# Patient Record
Sex: Male | Born: 1935 | Race: White | Hispanic: No | Marital: Married | Smoking: Never smoker
Health system: Southern US, Community
[De-identification: ages and names within clinical notes are randomized; demographics above are authoritative.]

## PROBLEM LIST (undated history)

## (undated) DIAGNOSIS — H409 Unspecified glaucoma: Secondary | ICD-10-CM

## (undated) DIAGNOSIS — B029 Zoster without complications: Secondary | ICD-10-CM

## (undated) DIAGNOSIS — Z789 Other specified health status: Secondary | ICD-10-CM

## (undated) DIAGNOSIS — N200 Calculus of kidney: Secondary | ICD-10-CM

## (undated) DIAGNOSIS — G459 Transient cerebral ischemic attack, unspecified: Secondary | ICD-10-CM

## (undated) DIAGNOSIS — I4891 Unspecified atrial fibrillation: Secondary | ICD-10-CM

## (undated) HISTORY — PX: NO PAST SURGERIES: SHX2092

## (undated) HISTORY — DX: Unspecified atrial fibrillation: I48.91

## (undated) HISTORY — DX: Unspecified glaucoma: H40.9

---

## 2000-08-18 ENCOUNTER — Encounter: Admission: RE | Admit: 2000-08-18 | Discharge: 2000-08-18 | Payer: Self-pay | Admitting: Urology

## 2000-08-18 ENCOUNTER — Encounter: Payer: Self-pay | Admitting: Urology

## 2000-08-20 ENCOUNTER — Encounter: Payer: Self-pay | Admitting: Urology

## 2000-08-21 ENCOUNTER — Encounter: Payer: Self-pay | Admitting: Urology

## 2000-08-21 ENCOUNTER — Ambulatory Visit (HOSPITAL_COMMUNITY): Admission: RE | Admit: 2000-08-21 | Discharge: 2000-08-21 | Payer: Self-pay | Admitting: Urology

## 2000-08-26 ENCOUNTER — Encounter: Payer: Self-pay | Admitting: Urology

## 2000-08-26 ENCOUNTER — Encounter: Admission: RE | Admit: 2000-08-26 | Discharge: 2000-08-26 | Payer: Self-pay | Admitting: Urology

## 2000-08-27 ENCOUNTER — Ambulatory Visit (HOSPITAL_COMMUNITY): Admission: RE | Admit: 2000-08-27 | Discharge: 2000-08-27 | Payer: Self-pay | Admitting: Urology

## 2001-10-21 ENCOUNTER — Encounter: Payer: Self-pay | Admitting: Urology

## 2001-10-21 ENCOUNTER — Encounter: Admission: RE | Admit: 2001-10-21 | Discharge: 2001-10-21 | Payer: Self-pay | Admitting: Urology

## 2008-10-11 ENCOUNTER — Encounter: Admission: RE | Admit: 2008-10-11 | Discharge: 2008-11-17 | Payer: Self-pay | Admitting: Family Medicine

## 2010-06-08 NOTE — Op Note (Signed)
[facility]  Patient:    Willie Cisneros, Willie Cisneros                     MRN: [id number] Proc. Date: 08/27/00 Adm. Date:  [id number] Attending:  Thermon Leyland CC:         Sigmund I. Patsi Sears, M.D.   Operative Report  PREOPERATIVE DIAGNOSIS:  Left mid ureteral calculus.  POSTOPERATIVE DIAGNOSIS:  Left mid ureteral calculus.  PROCEDURE:  Cystoscopy, ureteroscopy, holmium laser lithotripsy, double J stent placement.  SURGEON:  Barron Alvine, M.D.  ANESTHESIA:  General.  INDICATIONS FOR PROCEDURE:  [name] is a 77 year old male. He has generally been under the care of Dr. Patsi Sears and has had nephrolithiasis in the past. He recently had left flank pain and was diagnosed with a rather large 8 mm to 10 mm stone in the left mid ureter. Dr. Patsi Sears performed in situ lithotripsy. The patient had no passage of fragments. He did well clinically until approximately 48 hours ago when he began developing severe recurrent flank pain. Follow-up imaging showed no obvious change in the stone and it was in a similar location without any evidence of fragmentation. We therefore recommended consideration for alternative treatment options. We recommended holmium laser lithotripsy through a ureteroscope which he agreed to. Full informed consent was obtained.  TECHNIQUE AND FINDINGS:  The patient was brought to the operating room where he had successful induction of general anesthesia. He was placed in lithotomy position and prepped and draped in the usual manner. On fluoroscopy, one could see the obvious stone in the left mid ureter. Through the cystoscope, a Glidewire was passed to the left renal pelvis. A 6.5 French ureteroscope was then used. A large stone was encountered in the mid ureter. There was no evidence of any fragmentation from the lithotripsy. Utilizing the holmium laser fiber, we were to fracture the stone. It was a very hard stone and almost  certainly compromised of calcium oxalate monohydrate. With a laser, we were able to bust this into 20-30 pieces no larger than 1-2 mm. Over the guidewire, a 7 [place] 24 cm double J stent was placed with a dangle string which was attached to the patients penis. Lidocaine jelly was instilled per urethra and a B&O suppository was given. He was brought to the recovery room in stable condition. DD:  08/27/00 TD:  08/28/00 Job: [id number]

## 2012-11-18 ENCOUNTER — Encounter: Payer: Self-pay | Admitting: Podiatrist

## 2012-11-18 ENCOUNTER — Ambulatory Visit (INDEPENDENT_AMBULATORY_CARE_PROVIDER_SITE_OTHER): Payer: Medicare Other | Admitting: Podiatrist

## 2012-11-18 VITALS — BP 125/84 | HR 56 | Resp 17 | Ht 70.0 in | Wt 192.0 lb

## 2012-11-18 DIAGNOSIS — B351 Tinea unguium: Secondary | ICD-10-CM

## 2012-11-18 DIAGNOSIS — M79609 Pain in unspecified limb: Secondary | ICD-10-CM

## 2012-11-18 NOTE — Progress Notes (Signed)
HPI:  Patient presents today for follow up of foot and nail care. Denies any new complaints today.  Objective:  Patients chart is reviewed.  Neurovascular status unchanged.  Patients nails are thickened, discolored, distrophic, friable and brittle with yellow-brown discoloration. Patient subjectively relates they are painful with shoes and with ambulation.both feet  Assessment:  Symptomatic onychomycosis  Plan:  Discussed treatment options and alternatives.  The symptomatic toenails were debrided through manual an mechanical means without complication.  Return appointment recommended at routine intervals of 3 months   

## 2012-11-18 NOTE — Patient Instructions (Signed)
GENERAL FOOT HEALTH INFORMATION:  Moisturize your feet regularly with a cream based lotion such as Cetaphil (Cream) or Eucerin (Cream)- usually available in a tub or crock type of container.  Avoid applying the cream to the toe interspaces themselves to reduce the risk of a fungal infection between the toes.  After showering or bathing be sure to dry well between your toes.    Wear a good fitting shoe-  Running shoe brands I recommend are Brooks, New Balance and [employer]iics.  Always have a shoe fit specialist help you choose your shoes as there are many "varieties" of shoes and they can find you the best fit.  [employer]leet [employer]et sports/ [employer] ([place], [place], ), [employer], [employer] ([place]) have trained staff to help you in this process.  The Shoe Market in [place] has a great selection of euro-comfort casual shoes with good comfort and support as well.  Avoid prolonged use of thin ballet type flats, or flip flops.  Everyday use of these shoes can actually cause foot problems or injuries.  Watch your toenails for any signs of infection including drainage, pus redness or swelling along the sides of the toenails.  Soak in epsom salt water and use antibiotic ointment (OTC) if you notice this start to occur.  If the redness does not resolve within 2-3 days, call for an appointment to be seen.  

## 2013-01-27 ENCOUNTER — Ambulatory Visit (INDEPENDENT_AMBULATORY_CARE_PROVIDER_SITE_OTHER): Payer: Medicare Other | Admitting: Podiatrist

## 2013-01-27 ENCOUNTER — Encounter: Payer: Self-pay | Admitting: Podiatrist

## 2013-01-27 VITALS — BP 160/60 | HR 62 | Resp 12

## 2013-01-27 DIAGNOSIS — M79609 Pain in unspecified limb: Secondary | ICD-10-CM

## 2013-01-27 DIAGNOSIS — B351 Tinea unguium: Secondary | ICD-10-CM

## 2013-01-27 NOTE — Progress Notes (Signed)
HPI:  Patient presents today for follow up of foot and nail care. Denies any new complaints today.  Objective:  Patients chart is reviewed.  Neurovascular status unchanged.  Patients nails are thickened, discolored, distrophic, friable and brittle with yellow-brown discoloration. Patient subjectively relates they are painful with shoes and with ambulation.both feet  Assessment:  Symptomatic onychomycosis  Plan:  Discussed treatment options and alternatives.  The symptomatic toenails were debrided through manual an mechanical means without complication.  Return appointment recommended at routine intervals of 3 months   

## 2013-01-27 NOTE — Progress Notes (Deleted)
   Subjective:    Patient ID: Willie Cisneros, male    DOB: 1926/09/09, 80 y.o.   MRN: [id number]  HPI Comments: '' TOENAILS TRIM.''     Review of Systems     Objective:   Physical Exam        Assessment & Plan:

## 2013-04-01 ENCOUNTER — Ambulatory Visit (INDEPENDENT_AMBULATORY_CARE_PROVIDER_SITE_OTHER): Payer: Medicare Other | Admitting: Podiatrist

## 2013-04-01 ENCOUNTER — Encounter: Payer: Self-pay | Admitting: Podiatrist

## 2013-04-01 VITALS — BP 150/64 | HR 56 | Resp 12

## 2013-04-01 DIAGNOSIS — B351 Tinea unguium: Secondary | ICD-10-CM

## 2013-04-01 DIAGNOSIS — M79609 Pain in unspecified limb: Secondary | ICD-10-CM

## 2013-04-05 NOTE — Progress Notes (Signed)
HPI:  Patient presents today for follow up of foot and nail care. Denies any new complaints today.  Objective:  Patients chart is reviewed.  Vascular status reveals pedal pulses noted at 2 out of 4 dp and pt bilateral .  Neurological sensation is Normal to Semmes Weinstein monofilament bilateral.  Patients nails are thickened, discolored, distrophic, friable and brittle with yellow-brown discoloration. Patient subjectively relates they are painful with shoes and with ambulation of bilateral feet.  Assessment:  Symptomatic onychomycosis  Plan:  Discussed treatment options and alternatives.  The symptomatic toenails were debrided through manual an mechanical means without complication.  Return appointment recommended at routine intervals of 3 months    Destini Cambre, DPM   

## 2013-06-03 ENCOUNTER — Encounter: Payer: Self-pay | Admitting: Podiatrist

## 2013-06-03 ENCOUNTER — Ambulatory Visit (INDEPENDENT_AMBULATORY_CARE_PROVIDER_SITE_OTHER): Payer: Medicare Other | Admitting: Podiatrist

## 2013-06-03 VITALS — BP 154/72 | HR 59 | Resp 12

## 2013-06-03 DIAGNOSIS — B351 Tinea unguium: Secondary | ICD-10-CM

## 2013-06-03 DIAGNOSIS — M79609 Pain in unspecified limb: Secondary | ICD-10-CM

## 2013-06-04 NOTE — Progress Notes (Signed)
HPI:  Patient presents today for follow up of foot and nail care. Denies any new complaints today.  Objective:  Patients chart is reviewed.  Vascular status reveals pedal pulses noted at 2 out of 4 dp and pt bilateral .  Neurological sensation is Normal to Semmes Weinstein monofilament bilateral.  Patients nails are thickened, discolored, distrophic, friable and brittle with yellow-brown discoloration. Patient subjectively relates they are painful with shoes and with ambulation of bilateral feet.  Assessment:  Symptomatic onychomycosis  Plan:  Discussed treatment options and alternatives.  The symptomatic toenails were debrided through manual an mechanical means without complication.  Return appointment recommended at routine intervals of 3 months    Hilma Steinhilber, DPM   

## 2013-07-08 ENCOUNTER — Ambulatory Visit: Payer: Medicare Other | Admitting: Podiatrist

## 2013-08-12 ENCOUNTER — Ambulatory Visit (INDEPENDENT_AMBULATORY_CARE_PROVIDER_SITE_OTHER): Payer: Medicare Other | Admitting: Podiatrist

## 2013-08-12 VITALS — Resp 16

## 2013-08-12 DIAGNOSIS — B351 Tinea unguium: Secondary | ICD-10-CM

## 2013-08-12 DIAGNOSIS — M79609 Pain in unspecified limb: Secondary | ICD-10-CM

## 2013-08-12 DIAGNOSIS — M79673 Pain in unspecified foot: Secondary | ICD-10-CM

## 2013-08-12 NOTE — Progress Notes (Signed)
HPI:  Patient presents today for follow up of foot and nail care. Denies any new complaints today.  Objective:  Patients chart is reviewed.  Vascular status reveals pedal pulses noted at 2 out of 4 dp and pt bilateral .  Neurological sensation is Normal to Semmes Weinstein monofilament bilateral.  Patients nails are thickened, discolored, distrophic, friable and brittle with yellow-brown discoloration. Patient subjectively relates they are painful with shoes and with ambulation of bilateral feet.  Assessment:  Symptomatic onychomycosis  Plan:  Discussed treatment options and alternatives.  The symptomatic toenails were debrided through manual an mechanical means without complication.  Return appointment recommended at routine intervals of 3 months    Marceline Napierala, DPM   

## 2013-10-14 ENCOUNTER — Encounter: Payer: Self-pay | Admitting: Podiatrist

## 2013-10-14 ENCOUNTER — Ambulatory Visit (INDEPENDENT_AMBULATORY_CARE_PROVIDER_SITE_OTHER): Payer: Medicare Other | Admitting: Podiatrist

## 2013-10-14 DIAGNOSIS — M79676 Pain in unspecified toe(s): Principal | ICD-10-CM

## 2013-10-14 DIAGNOSIS — M79609 Pain in unspecified limb: Secondary | ICD-10-CM

## 2013-10-14 DIAGNOSIS — B351 Tinea unguium: Secondary | ICD-10-CM

## 2013-10-18 NOTE — Progress Notes (Signed)
HPI:  Patient presents today for follow up of foot and nail care. Denies any new complaints today.  Objective:  Patients chart is reviewed.  Vascular status reveals pedal pulses noted at 2 out of 4 dp and pt bilateral .  Neurological sensation is Normal to Semmes Weinstein monofilament bilateral.  Patients nails are thickened, discolored, distrophic, friable and brittle with yellow-brown discoloration. Patient subjectively relates they are painful with shoes and with ambulation of bilateral feet.  Assessment:  Symptomatic onychomycosis  Plan:  Discussed treatment options and alternatives.  The symptomatic toenails were debrided through manual an mechanical means without complication.  Return appointment recommended at routine intervals of 3 months    Kathryn Egerton, DPM   

## 2013-12-29 ENCOUNTER — Encounter: Payer: Self-pay | Admitting: Podiatrist

## 2013-12-29 ENCOUNTER — Ambulatory Visit (INDEPENDENT_AMBULATORY_CARE_PROVIDER_SITE_OTHER): Payer: Medicare Other | Admitting: Podiatrist

## 2013-12-29 DIAGNOSIS — M79676 Pain in unspecified toe(s): Secondary | ICD-10-CM

## 2013-12-29 DIAGNOSIS — B351 Tinea unguium: Secondary | ICD-10-CM

## 2014-01-10 NOTE — Progress Notes (Signed)
HPI:  Patient presents today for follow up of foot and nail care. Denies any new complaints today.  Objective:  Patients chart is reviewed.  Vascular status reveals pedal pulses noted at 2 out of 4 dp and pt bilateral .  Neurological sensation is Normal to Semmes Weinstein monofilament bilateral.  Patients nails are thickened, discolored, distrophic, friable and brittle with yellow-brown discoloration. Patient subjectively relates they are painful with shoes and with ambulation of bilateral feet.  Assessment:  Symptomatic onychomycosis  Plan:  Discussed treatment options and alternatives.  The symptomatic toenails were debrided through manual an mechanical means without complication.  Return appointment recommended at routine intervals of 3 months    Shaena Parkerson, DPM   

## 2014-03-03 ENCOUNTER — Encounter: Payer: Self-pay | Admitting: Podiatrist

## 2014-03-03 ENCOUNTER — Ambulatory Visit (INDEPENDENT_AMBULATORY_CARE_PROVIDER_SITE_OTHER): Payer: Medicare Other | Admitting: Podiatrist

## 2014-03-03 DIAGNOSIS — M79676 Pain in unspecified toe(s): Secondary | ICD-10-CM

## 2014-03-03 DIAGNOSIS — B351 Tinea unguium: Secondary | ICD-10-CM

## 2014-03-03 NOTE — Progress Notes (Signed)
HPI:  Patient presents today for follow up of foot and nail care. Denies any new complaints today.  Objective:  Patients chart is reviewed.  Vascular status reveals pedal pulses noted at 2 out of 4 dp and pt bilateral .  Neurological sensation is Normal to Semmes Weinstein monofilament bilateral.  Patients nails are thickened, discolored, distrophic, friable and brittle with yellow-brown discoloration. Patient subjectively relates they are painful with shoes and with ambulation of bilateral feet.  Assessment:  Symptomatic onychomycosis  Plan:  Discussed treatment options and alternatives.  The symptomatic toenails were debrided through manual an mechanical means without complication.  Return appointment recommended at routine intervals of 3 months    Kathryn Egerton, DPM   

## 2014-05-12 ENCOUNTER — Ambulatory Visit: Payer: Medicare Other | Admitting: Podiatrist

## 2014-05-18 ENCOUNTER — Ambulatory Visit (INDEPENDENT_AMBULATORY_CARE_PROVIDER_SITE_OTHER): Payer: Medicare Other | Admitting: Podiatry

## 2014-05-18 DIAGNOSIS — B351 Tinea unguium: Secondary | ICD-10-CM | POA: Diagnosis not present

## 2014-05-18 DIAGNOSIS — M79676 Pain in unspecified toe(s): Secondary | ICD-10-CM

## 2014-05-22 NOTE — Progress Notes (Signed)
Patient ID: Willie Cisneros, male   DOB: [birth year], 81 y.o.   MRN: [id number]  Subjective: 81 y.o.-year-old male returns the office today for painful, elongated, thickened toenails which he is unable to trim himself. Denies any redness or drainage around the nails. Denies any acute changes since last appointment and no new complaints today. Denies any systemic complaints such as fevers, chills, nausea, vomiting.   Objective: AAO 3, NAD DP/PT pulses palpable, CRT less than 3 seconds Protective sensation intact with Simms Weinstein monofilament, Achilles tendon reflex intact.  Nails hypertrophic, dystrophic, elongated, brittle, discolored 10. There is tenderness overlying these nails. There is no surrounding erythema or drainage along the nail sites. No open lesions or pre-ulcerative lesions are identified. No other areas of tenderness bilateral lower extremities. No overlying edema, erythema, increased warmth. No pain with calf compression, swelling, warmth, erythema.  Assessment: Patient presents with symptomatic onychomycosis  Plan: -Treatment options including alternatives, risks, complications were discussed -Nails sharply debrided 10 without complication/bleeding. -Discussed daily foot inspection. If there are any changes, to call the office immediately.  -Follow-up in 3 months or sooner if any problems are to arise. In the meantime, encouraged to call the office with any questions, concerns, changes symptoms.

## 2014-07-20 ENCOUNTER — Ambulatory Visit: Payer: Medicare Other | Admitting: Podiatry

## 2014-07-22 ENCOUNTER — Ambulatory Visit: Payer: Medicare Other

## 2014-08-04 ENCOUNTER — Ambulatory Visit: Payer: Medicare Other | Admitting: Podiatry

## 2019-04-03 ENCOUNTER — Inpatient Hospital Stay (HOSPITAL_COMMUNITY)
Admission: EM | Admit: 2019-04-03 | Discharge: 2019-04-09 | DRG: 175 | Disposition: A | Discharge status: Rehabilitation Facility | Payer: Medicare Other | Attending: Family Medicine | Admitting: Family Medicine

## 2019-04-03 ENCOUNTER — Emergency Department (HOSPITAL_COMMUNITY): Payer: Medicare Other

## 2019-04-03 ENCOUNTER — Inpatient Hospital Stay (HOSPITAL_COMMUNITY): Payer: Medicare Other

## 2019-04-03 ENCOUNTER — Other Ambulatory Visit: Payer: Self-pay

## 2019-04-03 ENCOUNTER — Encounter (HOSPITAL_COMMUNITY): Payer: Self-pay | Admitting: Pulmonary Disease

## 2019-04-03 DIAGNOSIS — S86891D Other injury of other muscle(s) and tendon(s) at lower leg level, right leg, subsequent encounter: Secondary | ICD-10-CM | POA: Diagnosis not present

## 2019-04-03 DIAGNOSIS — I82401 Acute embolism and thrombosis of unspecified deep veins of right lower extremity: Secondary | ICD-10-CM | POA: Diagnosis present

## 2019-04-03 DIAGNOSIS — W010XXD Fall on same level from slipping, tripping and stumbling without subsequent striking against object, subsequent encounter: Secondary | ICD-10-CM | POA: Diagnosis present

## 2019-04-03 DIAGNOSIS — E8809 Other disorders of plasma-protein metabolism, not elsewhere classified: Secondary | ICD-10-CM | POA: Diagnosis not present

## 2019-04-03 DIAGNOSIS — S83241D Other tear of medial meniscus, current injury, right knee, subsequent encounter: Secondary | ICD-10-CM | POA: Diagnosis not present

## 2019-04-03 DIAGNOSIS — H409 Unspecified glaucoma: Secondary | ICD-10-CM | POA: Diagnosis present

## 2019-04-03 DIAGNOSIS — I4819 Other persistent atrial fibrillation: Secondary | ICD-10-CM | POA: Diagnosis not present

## 2019-04-03 DIAGNOSIS — D62 Acute posthemorrhagic anemia: Secondary | ICD-10-CM | POA: Diagnosis present

## 2019-04-03 DIAGNOSIS — Z880 Allergy status to penicillin: Secondary | ICD-10-CM | POA: Diagnosis not present

## 2019-04-03 DIAGNOSIS — I4892 Unspecified atrial flutter: Secondary | ICD-10-CM | POA: Diagnosis present

## 2019-04-03 DIAGNOSIS — Y92008 Other place in unspecified non-institutional (private) residence as the place of occurrence of the external cause: Secondary | ICD-10-CM | POA: Diagnosis not present

## 2019-04-03 DIAGNOSIS — S80211A Abrasion, right knee, initial encounter: Secondary | ICD-10-CM | POA: Diagnosis present

## 2019-04-03 DIAGNOSIS — Z8673 Personal history of transient ischemic attack (TIA), and cerebral infarction without residual deficits: Secondary | ICD-10-CM | POA: Diagnosis not present

## 2019-04-03 DIAGNOSIS — W010XXA Fall on same level from slipping, tripping and stumbling without subsequent striking against object, initial encounter: Secondary | ICD-10-CM | POA: Diagnosis present

## 2019-04-03 DIAGNOSIS — Z85828 Personal history of other malignant neoplasm of skin: Secondary | ICD-10-CM

## 2019-04-03 DIAGNOSIS — Z87442 Personal history of urinary calculi: Secondary | ICD-10-CM | POA: Diagnosis not present

## 2019-04-03 DIAGNOSIS — E46 Unspecified protein-calorie malnutrition: Secondary | ICD-10-CM | POA: Diagnosis not present

## 2019-04-03 DIAGNOSIS — I2602 Saddle embolus of pulmonary artery with acute cor pulmonale: Secondary | ICD-10-CM

## 2019-04-03 DIAGNOSIS — S0240FA Zygomatic fracture, left side, initial encounter for closed fracture: Secondary | ICD-10-CM | POA: Diagnosis present

## 2019-04-03 DIAGNOSIS — R5381 Other malaise: Secondary | ICD-10-CM | POA: Diagnosis present

## 2019-04-03 DIAGNOSIS — S0240FD Zygomatic fracture, left side, subsequent encounter for fracture with routine healing: Secondary | ICD-10-CM | POA: Diagnosis not present

## 2019-04-03 DIAGNOSIS — Z881 Allergy status to other antibiotic agents status: Secondary | ICD-10-CM

## 2019-04-03 DIAGNOSIS — Z91041 Radiographic dye allergy status: Secondary | ICD-10-CM | POA: Diagnosis not present

## 2019-04-03 DIAGNOSIS — S83241A Other tear of medial meniscus, current injury, right knee, initial encounter: Secondary | ICD-10-CM | POA: Diagnosis present

## 2019-04-03 DIAGNOSIS — T1490XA Injury, unspecified, initial encounter: Secondary | ICD-10-CM | POA: Diagnosis not present

## 2019-04-03 DIAGNOSIS — Z79899 Other long term (current) drug therapy: Secondary | ICD-10-CM | POA: Diagnosis not present

## 2019-04-03 DIAGNOSIS — I429 Cardiomyopathy, unspecified: Secondary | ICD-10-CM | POA: Diagnosis present

## 2019-04-03 DIAGNOSIS — Y9301 Activity, walking, marching and hiking: Secondary | ICD-10-CM | POA: Diagnosis present

## 2019-04-03 DIAGNOSIS — I2699 Other pulmonary embolism without acute cor pulmonale: Secondary | ICD-10-CM | POA: Diagnosis present

## 2019-04-03 DIAGNOSIS — E440 Moderate protein-calorie malnutrition: Secondary | ICD-10-CM | POA: Diagnosis present

## 2019-04-03 DIAGNOSIS — I4891 Unspecified atrial fibrillation: Secondary | ICD-10-CM

## 2019-04-03 DIAGNOSIS — Z6824 Body mass index (BMI) 24.0-24.9, adult: Secondary | ICD-10-CM | POA: Diagnosis not present

## 2019-04-03 DIAGNOSIS — M25461 Effusion, right knee: Secondary | ICD-10-CM | POA: Diagnosis present

## 2019-04-03 DIAGNOSIS — I2609 Other pulmonary embolism with acute cor pulmonale: Principal | ICD-10-CM | POA: Diagnosis present

## 2019-04-03 DIAGNOSIS — S96912A Strain of unspecified muscle and tendon at ankle and foot level, left foot, initial encounter: Secondary | ICD-10-CM | POA: Diagnosis present

## 2019-04-03 DIAGNOSIS — Z885 Allergy status to narcotic agent status: Secondary | ICD-10-CM | POA: Diagnosis not present

## 2019-04-03 DIAGNOSIS — Z20822 Contact with and (suspected) exposure to covid-19: Secondary | ICD-10-CM | POA: Diagnosis present

## 2019-04-03 DIAGNOSIS — Z7901 Long term (current) use of anticoagulants: Secondary | ICD-10-CM

## 2019-04-03 DIAGNOSIS — S96912D Strain of unspecified muscle and tendon at ankle and foot level, left foot, subsequent encounter: Secondary | ICD-10-CM | POA: Diagnosis not present

## 2019-04-03 DIAGNOSIS — I248 Other forms of acute ischemic heart disease: Secondary | ICD-10-CM | POA: Diagnosis present

## 2019-04-03 DIAGNOSIS — K5900 Constipation, unspecified: Secondary | ICD-10-CM | POA: Diagnosis present

## 2019-04-03 DIAGNOSIS — G4700 Insomnia, unspecified: Secondary | ICD-10-CM | POA: Diagnosis present

## 2019-04-03 DIAGNOSIS — Z882 Allergy status to sulfonamides status: Secondary | ICD-10-CM | POA: Diagnosis not present

## 2019-04-03 HISTORY — DX: Calculus of kidney: N20.0

## 2019-04-03 HISTORY — DX: Transient cerebral ischemic attack, unspecified: G45.9

## 2019-04-03 HISTORY — DX: Other specified health status: Z78.9

## 2019-04-03 HISTORY — DX: Zoster without complications: B02.9

## 2019-04-03 LAB — BASIC METABOLIC PANEL
Anion gap: 10 (ref 5–15)
BUN: 17 mg/dL (ref 8–23)
CO2: 24 mmol/L (ref 22–32)
Calcium: 9 mg/dL (ref 8.9–10.3)
Chloride: 104 mmol/L (ref 98–111)
Creatinine, Ser: 1 mg/dL (ref 0.61–1.24)
GFR calc Af Amer: >60 mL/min (ref >60)
GFR calc non Af Amer: >60 mL/min (ref >60)
Glucose, Bld: 146 mg/dL — ABNORMAL HIGH (ref 70–99)
Potassium: 4.6 mmol/L (ref 3.5–5.1)
Sodium: 138 mmol/L (ref 135–145)

## 2019-04-03 LAB — ECHOCARDIOGRAM COMPLETE
Height: 69 in
Weight: 2800 oz

## 2019-04-03 LAB — CBC
HCT: 44 % (ref 39.0–52.0)
Hemoglobin: 14.3 g/dL (ref 13.0–17.0)
MCH: 31.2 pg (ref 26.0–34.0)
MCHC: 32.5 g/dL (ref 30.0–36.0)
MCV: 95.9 fL (ref 80.0–100.0)
Platelets: 127 10*3/uL — ABNORMAL LOW (ref 150–400)
RBC: 4.59 MIL/uL (ref 4.22–5.81)
RDW: 13.5 % (ref 11.5–15.5)
WBC: 12 10*3/uL — ABNORMAL HIGH (ref 4.0–10.5)
nRBC: 0 % (ref 0.0–0.2)

## 2019-04-03 LAB — TROPONIN I (HIGH SENSITIVITY)
Troponin I (High Sensitivity): 369 ng/L (ref <18)
Troponin I (High Sensitivity): 450 ng/L (ref <18)

## 2019-04-03 LAB — RESPIRATORY PANEL BY RT PCR (FLU A&B, COVID)
Influenza A by PCR: NEGATIVE
Influenza B by PCR: NEGATIVE
SARS Coronavirus 2 by RT PCR: NEGATIVE

## 2019-04-03 LAB — BRAIN NATRIURETIC PEPTIDE: B Natriuretic Peptide: 125.9 pg/mL — ABNORMAL HIGH (ref 0.0–100.0)

## 2019-04-03 LAB — MRSA PCR SCREENING: MRSA by PCR: NEGATIVE

## 2019-04-03 LAB — HEPARIN LEVEL (UNFRACTIONATED): Heparin Unfractionated: 0.2 IU/mL — ABNORMAL LOW (ref 0.30–0.70)

## 2019-04-03 MED ORDER — ORAL CARE MOUTH RINSE
15.0000 mL | Freq: Two times a day (BID) | OROMUCOSAL | Status: DC
  Administered 2019-04-03 – 2019-04-09 (×12): 15 mL via OROMUCOSAL

## 2019-04-03 MED ORDER — HEPARIN BOLUS VIA INFUSION
2500.0000 [IU] | Freq: Once | INTRAVENOUS | Status: AC
  Administered 2019-04-03: 2500 [IU] via INTRAVENOUS
  Filled 2019-04-03: qty 2500

## 2019-04-03 MED ORDER — IOHEXOL 350 MG/ML SOLN
100.0000 mL | Freq: Once | INTRAVENOUS | Status: AC | PRN
  Administered 2019-04-03: 100 mL via INTRAVENOUS

## 2019-04-03 MED ORDER — CHLORHEXIDINE GLUCONATE CLOTH 2 % EX PADS
6.0000 | MEDICATED_PAD | Freq: Every day | CUTANEOUS | Status: DC
  Administered 2019-04-03 – 2019-04-04 (×2): 6 via TOPICAL

## 2019-04-03 MED ORDER — SODIUM CHLORIDE (PF) 0.9 % IJ SOLN
INTRAMUSCULAR | Status: AC
  Filled 2019-04-03: qty 50

## 2019-04-03 MED ORDER — HEPARIN (PORCINE) 25000 UT/250ML-% IV SOLN
1400.0000 [IU]/h | INTRAVENOUS | Status: DC
  Administered 2019-04-03: 1200 [IU]/h via INTRAVENOUS
  Administered 2019-04-04: 1300 [IU]/h via INTRAVENOUS
  Administered 2019-04-05: 1400 [IU]/h via INTRAVENOUS
  Filled 2019-04-03 (×3): qty 250

## 2019-04-03 NOTE — ED Triage Notes (Signed)
Per patient, he is having sharp pains in left chest area. Patient says he can only take short breaths due to intense pain. Pain rated 9/10. Pain started 04/01/2019

## 2019-04-03 NOTE — H&P (Signed)
NAME:  Willie Cisneros, MRN:  [id number], DOB:  1926/12/08, LOS: 0 ADMISSION DATE:  04/03/2019, CONSULTATION DATE: 04/03/2019 REFERRING MD: Dr. Fredderick Phenix, CHIEF COMPLAINT: Pulmonary embolism  Brief History   Elderly gentleman who presented with shortness of breath and chest discomfort. He recently had sustained a left foot strain for which he had a boot, he tripped on the boot and fell on March 7.  He did hit his head right side of his face, stated his head bounced off the concrete about twice.  There was no loss of consciousness. Shortness of breath with minimal exertion, left-sided chest pains He has had some pain in his right knee as well following the fall Unable to take deep breaths because of pain Past Medical History  Only significant for glaucoma  Significant Hospital Events   CT scan of the chest  Consults:  pccm  Procedures:  None  Significant Diagnostic Tests:  CT chest The examination is positive for pulmonary embolus with findings worrisome for right heart strain. Peripheral wedge-shaped opacity in left lower lobe is worrisome for a pulmonary infarct.  CT head IMPRESSION: 1. No evidence of acute intracranial abnormality. No evidence of calvarial fracture. 2. Mild chronic small vessel ischemic changes in the cerebral white matter. 3. Nondisplaced left zygomatic arch fracture appears well corticated and chronic, with no overlying soft tissue swelling. 4. No cervical spine fracture or facet subluxation. 5. 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 6. Mild multilevel cervical spondylolisthesis is probably degenerative.  Micro Data:  None  Antimicrobials:  None  Interim history/subjective:  Elderly gentleman, feels more comfortable since has been in the hospital Still has some chest discomfort, breathing feels a little bit better  Objective   Blood pressure (!) 149/94, pulse (!) 129, temperature 98.1 F (36.7 C),  temperature source Oral, resp. rate (!) 31, height 5\' 9"  (1.753 m), weight 79.4 kg, SpO2 100 %.       No intake or output data in the 24 hours ending 04/03/19 1408 Filed Weights   04/03/19 0837  Weight: 79.4 kg    Examination: General: Elderly gentleman, ecchymosis, periorbital edema on the right HENT: Moist oral mucosa Lungs: Decreased air movement secondary to poor effort, no added sounds Cardiovascular: S1-S2 appreciated Abdomen: Bowel sounds appreciated Extremities: No clubbing, no edema, has a boot on the left foot Neuro: Alert and oriented x3 GU:   Resolved Hospital Problem list     Assessment & Plan:   Pulmonary embolism -Presence of right heart strain -Not hypotensive -Tachycardic, tachypneic -Elevated troponin -Not requiring oxygen -Anticoagulation with heparin  Pulmonary infarct -Related to his pulmonary embolism -He declines to have any pain medications at present -I did reassure him that we can put him on pain medications as needed  Recent facial trauma on March 7 with periorbital ecchymosis, periorbital edema His recent facial trauma will make him a poor candidate for thrombolytics, this combined with hemodynamic stability Continue anticoagulation We will monitor him closely for any risk of decompensation Trend his troponins   Stat echocardiogram ordered   Best practice:  Diet: Regular diet Pain/Anxiety/Delirium protocol (if indicated): None VAP protocol (if indicated): Not indicated DVT prophylaxis: On anticoagulation GI prophylaxis: Pepcid Glucose control: Monitor Mobility: Bedrest Code Status: Full code Family Communication: Called son Billie, went to voicemail Disposition: ICU  Labs   CBC: Recent Labs  Lab 04/03/19 0917  WBC 12.0*  HGB 14.3  HCT 44.0  MCV 95.9  PLT 127*    Basic  Metabolic Panel: Recent Labs  Lab 04/03/19 0917  NA 138  K 4.6  CL 104  CO2 24  GLUCOSE 146*  BUN 17  CREATININE 1.00  CALCIUM 9.0    GFR: Estimated Creatinine Clearance: 49.1 mL/min (by C-G formula based on SCr of 1 mg/dL). Recent Labs  Lab 04/03/19 0917  WBC 12.0*    Liver Function Tests: No results for input(s): AST, ALT, ALKPHOS, BILITOT, PROT, ALBUMIN in the last 168 hours. No results for input(s): LIPASE, AMYLASE in the last 168 hours. No results for input(s): AMMONIA in the last 168 hours.  ABG No results found for: PHART, PCO2ART, PO2ART, HCO3, TCO2, ACIDBASEDEF, O2SAT   Coagulation Profile: No results for input(s): INR, PROTIME in the last 168 hours.  Cardiac Enzymes: No results for input(s): CKTOTAL, CKMB, CKMBINDEX, TROPONINI in the last 168 hours.  HbA1C: No results found for: HGBA1C  CBG: No results for input(s): GLUCAP in the last 168 hours.  Review of Systems:   Chest pain discomfort  Past Medical History  He,  has a past medical history of Glaucoma.   Surgical History      Social History   reports that he has never smoked. He does not have any smokeless tobacco history on file.   Family History   His family history is not on file.   Allergies Allergies  Allergen Reactions  . Penicillins Anaphylaxis    Did it involve swelling of the face/tongue/throat, SOB, or low BP? Yes Did it involve sudden or severe rash/hives, skin peeling, or any reaction on the inside of your mouth or nose? Yes Did you need to seek medical attention at a hospital or doctor's office? Yes When did it last happen?Over 10 years ago If all above answers are "NO", may proceed with cephalosporin use.  . Sulfa Antibiotics Anaphylaxis and Rash  . Clindamycin Nausea Only  . Contrast Media  [Iodinated Diagnostic Agents] Nausea Only  . Econazole Nitrate Itching and Other (See Comments)    Burning  . Erythromycin Nausea Only  . Oxycodone Nausea And Vomiting    Sherrilyn Rist, MD [place], PCCM Cell: [phone]

## 2019-04-03 NOTE — ED Notes (Signed)
Date and time results received: 04/03/19 11:04 AM  (use smartphrase ".now" to insert current time)  Test: troponin Critical Value: 369  Name of Provider Notified: [facility]  Orders Received? Or Actions Taken?:

## 2019-04-03 NOTE — Progress Notes (Signed)
Echocardiogram 2D Echocardiogram has been performed.  Willie Cisneros 04/03/2019, 2:58 PM   Notified Dr. Jacques Navy of stat at 2:55

## 2019-04-03 NOTE — ED Notes (Signed)
Dr. Fredderick Phenix aware patients second troponin is 450.

## 2019-04-03 NOTE — Progress Notes (Signed)
ANTICOAGULATION CONSULT NOTE - Initial Consult  Pharmacy Consult for IV heparin  Indication: pulmonary embolus  Allergies  Allergen Reactions  . Penicillins Anaphylaxis    Did it involve swelling of the face/tongue/throat, SOB, or low BP? Yes Did it involve sudden or severe rash/hives, skin peeling, or any reaction on the inside of your mouth or nose? Yes Did you need to seek medical attention at a hospital or doctor's office? Yes When did it last happen?Over 10 years ago If all above answers are "NO", may proceed with cephalosporin use.  . Sulfa Antibiotics Anaphylaxis and Rash  . Clindamycin Nausea Only  . Contrast Media  [Iodinated Diagnostic Agents] Nausea Only  . Econazole Nitrate Itching and Other (See Comments)    Burning  . Erythromycin Nausea Only  . Oxycodone Nausea And Vomiting    Patient Measurements: Height: 5\' 9"  (175.3 cm) Weight: 175 lb (79.4 kg) IBW/kg (Calculated) : 70.7 Heparin Dosing Weight: 79.4 kg  Vital Signs: Temp: 98.1 F (36.7 C) (03/13 1106) Temp Source: Oral (03/13 1106) BP: 149/94 (03/13 1200) Pulse Rate: 129 (03/13 1208)  Labs: Recent Labs    04/03/19 0917 04/03/19 1034  HGB 14.3  --   HCT 44.0  --   PLT 127*  --   CREATININE 1.00  --   TROPONINIHS 369* 450*    Estimated Creatinine Clearance: 49.1 mL/min (by C-G formula based on SCr of 1 mg/dL).   Medical History: Past Medical History:  Diagnosis Date  . Glaucoma     Medications:  Scheduled:  . sodium chloride (PF)       Infusions:    Assessment: 86 yo male presented to ED with sharp pains in left chest area found to have PE worrisome for heart strain per CT exam. To start IV heparin per Rx. Baseline labs drawn.   Goal of Therapy:  Heparin level 0.3-0.7 units/ml Monitor platelets by anticoagulation protocol: Yes   Plan:   IV heparin 2500 unit bolus then  IV heparin infusion rate of 1200 units/hr  Check heparin level 8 hours after start of IV  heparin  Daily CBC   95 04/03/2019,1:14 PM

## 2019-04-03 NOTE — ED Provider Notes (Signed)
[facility]-EMERGENCY DEPT Provider Note   CSN: [id number] Arrival date & time: 04/03/19  0272     History Chief Complaint  Patient presents with  . Chest Pain  . Shortness of Breath    Willie Cisneros is a 86 y.o. male.  Patient is a 86 year old male who presents with shortness of breath.  He states that he had a fall on March 7.  He has been wearing a postop shoe for stress fracture of his toe.  He said his right foot stepped on the postop shoe that was on the left foot and he tripped, falling over onto his right side.  He hit his right side of his head.  There is no loss of consciousness.  He has had some swelling and pain to his right temporal area.  He said he saw his ophthalmologist who said his eye looked okay but he should get further imaging of the bony structures around his eye.  He never went for evaluation of that.  He said that over the last few days he has had some worsening shortness of breath.  He reports shortness of breath with minimal exertion.  He has a sharp pain in his left chest that is worse with breathing.  He is only able to take short rapid breaths because of the pain.  He denies any known injury to that chest wall on falling.  He says that he was starting to have some shortness of breath a week or so before the fall but it seems to have gotten worse since the fall.  He denies any change in his leg swelling.  No fevers.  No cough or cold symptoms.  He does say that his knee is also been hurting since the fall.  It was not hurting initially but over the last couple days is gotten worse.  He feels like he is having some cramping in his upper thigh that makes his leg feel weak.  He denies any weakness in his upper extremities.  No other injuries noted from the fall.        Past Medical History:  Diagnosis Date  . Glaucoma     There are no problems to display for this patient.    The histories are not reviewed yet. Please review them in the  "History" navigator section and refresh this SmartLink.     No family history on file.  Social History   Tobacco Use  . Smoking status: Never Smoker  Substance Use Topics  . Alcohol use: Not on file  . Drug use: Not on file    Home Medications Prior to Admission medications   Medication Sig Start Date End Date Taking? Authorizing Provider  latanoprost (XALATAN) 0.005 % ophthalmic solution Place 1 drop into both eyes at bedtime.    Yes [provider]  Multiple Vitamins-Minerals (CENTRUM SILVER 50+MEN) TABS Take 1 tablet by mouth daily.   Yes [provider]    Allergies    Penicillins, Sulfa antibiotics, Clindamycin, Contrast media  [iodinated diagnostic agents], Econazole nitrate, Erythromycin, and Oxycodone  Review of Systems   Review of Systems  Constitutional: Positive for fatigue. Negative for chills, diaphoresis and fever.  HENT: Negative for congestion, rhinorrhea and sneezing.   Eyes: Negative.   Respiratory: Positive for shortness of breath. Negative for cough and chest tightness.   Cardiovascular: Positive for chest pain. Negative for leg swelling.  Gastrointestinal: Negative for abdominal pain, blood in stool, diarrhea, nausea and vomiting.  Genitourinary: Negative for difficulty urinating, flank pain, frequency and hematuria.  Musculoskeletal: Positive for arthralgias. Negative for back pain.  Skin: Negative for rash.  Neurological: Negative for dizziness, speech difficulty, weakness, numbness and headaches.    Physical Exam Updated Vital Signs BP (!) 149/94   Pulse (!) 129   Temp 98.1 F (36.7 C) (Oral)   Resp (!) 31   Ht 5\' 9"  (1.753 m)   Wt 79.4 kg   SpO2 100%   BMI 25.84 kg/m   Physical Exam Constitutional:      Appearance: He is well-developed.  HENT:     Head: Normocephalic.     Comments: Patient has some old appearing ecchymosis to the right forehead and right temporal area.  There is some right periorbital  ecchymosis. Eyes:     Extraocular Movements: Extraocular movements intact.     Conjunctiva/sclera: Conjunctivae normal.     Pupils: Pupils are equal, round, and reactive to light.  Cardiovascular:     Rate and Rhythm: Regular rhythm. Tachycardia present.     Heart sounds: Normal heart sounds.     Comments: No tenderness to the rib cage, no visible external trauma noted to the chest or abdomen Pulmonary:     Effort: Pulmonary effort is normal. Tachypnea present. No respiratory distress.     Breath sounds: Normal breath sounds. No wheezing or rales.  Chest:     Chest wall: No tenderness.  Abdominal:     General: Bowel sounds are normal.     Palpations: Abdomen is soft.     Tenderness: There is no abdominal tenderness. There is no guarding or rebound.  Musculoskeletal:        General: Normal range of motion.     Cervical back: Normal range of motion and neck supple.     Comments: 1+ edema to the lower extremities bilaterally, slightly worse on the left, there is an abrasion over the right knee.  There are some mild tenderness on palpation of the knee.  No significant swelling.  He is able do a straight leg raise.  Pedal pulses are intact bilaterally.  There is no other pain on palpation or range of motion the extremities  Lymphadenopathy:     Cervical: No cervical adenopathy.  Skin:    General: Skin is warm and dry.     Findings: No rash.  Neurological:     Mental Status: He is alert and oriented to person, place, and time.     Comments: 5 out of 5 strength in all extremities, sensation grossly intact to light touch in all extremities, cranial nerves II through XII grossly intact     ED Results / Procedures / Treatments   Labs (all labs ordered are listed, but only abnormal results are displayed) Labs Reviewed  BASIC METABOLIC PANEL - Abnormal; Notable for the following components:      Result Value   Glucose, Bld 146 (*)    All other components within normal limits  CBC -  Abnormal; Notable for the following components:   WBC 12.0 (*)    Platelets 127 (*)    All other components within normal limits  BRAIN NATRIURETIC PEPTIDE - Abnormal; Notable for the following components:   B Natriuretic Peptide 125.9 (*)    All other components within normal limits  TROPONIN I (HIGH SENSITIVITY) - Abnormal; Notable for the following components:   Troponin I (High Sensitivity) 369 (*)    All other components within normal limits  TROPONIN I (HIGH  SENSITIVITY) - Abnormal; Notable for the following components:   Troponin I (High Sensitivity) 450 (*)    All other components within normal limits  RESPIRATORY PANEL BY RT PCR (FLU A&B, COVID)  HEPARIN LEVEL (UNFRACTIONATED)    EKG EKG Interpretation  Date/Time:  Saturday April 03 2019 09:09:13 EST Ventricular Rate:  132 PR Interval:    QRS Duration: 95 QT Interval:  317 QTC Calculation: 470 R Axis:   -62 Text Interpretation: Sinus tachycardia LAD, consider left anterior fascicular block Confirmed by Rolan BuccoBelfi, Gianmarco Roye [phone]([id number]) on 04/03/2019 9:24:59 AM   Radiology DG Chest 2 View  Result Date: 04/03/2019 CLINICAL DATA:  Left chest pain EXAM: CHEST - 2 VIEW COMPARISON:  None. FINDINGS: Mild blunting left costophrenic angle, favored to reflect epicardial fat, clear on the lateral view. Right lung is clear. No pleural effusion or pneumothorax. The heart is top-normal in size. Visualized osseous structures are within normal limits. IMPRESSION: Normal chest radiographs. Electronically Signed   By: Charline BillsSriyesh  Krishnan M.D.   On: 04/03/2019 09:22   CT Head Wo Contrast  Result Date: 04/03/2019 CLINICAL DATA:  Fall a few days prior. Right orbital bruise. Posterior neck pain. EXAM: CT HEAD WITHOUT CONTRAST CT CERVICAL SPINE WITHOUT CONTRAST TECHNIQUE: Multidetector CT imaging of the head and cervical spine was performed following the standard protocol without intravenous contrast. Multiplanar CT image reconstructions of the cervical  spine were also generated. COMPARISON:  None. FINDINGS: CT HEAD FINDINGS Brain: No evidence of parenchymal hemorrhage or extra-axial fluid collection. No mass lesion, mass effect, or midline shift. No CT evidence of acute infarction. Nonspecific mild subcortical and periventricular white matter hypodensity, most in keeping with chronic small vessel ischemic change. Cerebral volume is age appropriate. No ventriculomegaly. Vascular: No acute abnormality. Skull: No evidence of calvarial fracture. Nondisplaced left zygomatic arch fracture appears well corticated and chronic, with no overlying soft tissue swelling. Sinuses/Orbits: The visualized paranasal sinuses are essentially clear. Other:  The mastoid air cells are unopacified. CT CERVICAL SPINE FINDINGS Alignment: Straightening of the cervical spine. No facet subluxation. Dens is well positioned between the lateral masses of C1. Mild 3 mm retrolisthesis at C5-6 and C6-7 and 3 mm anterolisthesis at C7-T1. Skull base and vertebrae: No acute fracture. No primary bone lesion or focal pathologic process. Soft tissues and spinal canal: No prevertebral edema. No visible canal hematoma. Disc levels: Severe multilevel degenerative disc disease throughout the cervical spine, most prominent at C3-4, C5-6 and C6-7. Moderate to severe bilateral cervical facet arthropathy. Moderate degenerative foraminal stenosis bilaterally at C5-6. Upper chest: No acute abnormality. Other: Visualized mastoid air cells appear clear. No discrete thyroid nodules. No pathologically enlarged cervical nodes. IMPRESSION: 1. No evidence of acute intracranial abnormality. No evidence of calvarial fracture. 2. Mild chronic small vessel ischemic changes in the cerebral white matter. 3. Nondisplaced left zygomatic arch fracture appears well corticated and chronic, with no overlying soft tissue swelling. 4. No cervical spine fracture or facet subluxation. 5. Advanced multilevel degenerative changes in the  cervical spine as detailed. 6. Mild multilevel cervical spondylolisthesis is probably degenerative. Electronically Signed   By: Delbert PhenixJason A Poff M.D.   On: 04/03/2019 11:56   CT Angio Chest PE W and/or Wo Contrast  Result Date: 04/03/2019 CLINICAL DATA:  Severe left chest pain today. EXAM: CT ANGIOGRAPHY CHEST WITH CONTRAST TECHNIQUE: Multidetector CT imaging of the chest was performed using the standard protocol during bolus administration of intravenous contrast. Multiplanar CT image reconstructions and MIPs were obtained to evaluate the vascular anatomy.  CONTRAST:  100 mL OMNIPAQUE IOHEXOL 350 MG/ML SOLN COMPARISON:  PA and lateral chest today. FINDINGS: Cardiovascular: The examination is positive for pulmonary embolus. Clot is best seen in the left main pulmonary artery and its branches to both the upper and lower lobes. The RV to LV ratio is 1.22 worrisome for right heart strain. Cardiomegaly and calcific aortic and coronary atherosclerosis are noted. No pericardial effusion. Thoracic aorta is normal in caliber. Mediastinum/Nodes: No enlarged mediastinal, hilar, or axillary lymph nodes. Thyroid gland, trachea, and esophagus demonstrate no significant findings. Lungs/Pleura: Very small left pleural effusion is seen. Wedge-shaped peripheral opacity in the left lower lobe is worrisome for a pulmonary infarct. Dependent atelectasis is noted. Upper Abdomen: Single, small nonobstructing stones are seen in each kidney Musculoskeletal: No fracture or worrisome lesion. Multilevel spondylosis and mild scoliosis noted. Review of the MIP images confirms the above findings. IMPRESSION: The examination is positive for pulmonary embolus with findings worrisome for right heart strain. Peripheral wedge-shaped opacity in left lower lobe is worrisome for a pulmonary infarct. Cardiomegaly. Critical Value/emergent results were called by telephone at the time of interpretation on 04/03/2019 at 11:52 am to provider Amairani Shuey , who  verbally acknowledged these results. Aortic Atherosclerosis (ICD10-I70.0). Calcific coronary artery disease also noted. Electronically Signed   By: Drusilla Kanner M.D.   On: 04/03/2019 11:55   CT Cervical Spine Wo Contrast  Result Date: 04/03/2019 CLINICAL DATA:  Fall a few days prior. Right orbital bruise. Posterior neck pain. EXAM: CT HEAD WITHOUT CONTRAST CT CERVICAL SPINE WITHOUT CONTRAST TECHNIQUE: Multidetector CT imaging of the head and cervical spine was performed following the standard protocol without intravenous contrast. Multiplanar CT image reconstructions of the cervical spine were also generated. COMPARISON:  None. FINDINGS: CT HEAD FINDINGS Brain: No evidence of parenchymal hemorrhage or extra-axial fluid collection. No mass lesion, mass effect, or midline shift. No CT evidence of acute infarction. Nonspecific mild subcortical and periventricular white matter hypodensity, most in keeping with chronic small vessel ischemic change. Cerebral volume is age appropriate. No ventriculomegaly. Vascular: No acute abnormality. Skull: No evidence of calvarial fracture. Nondisplaced left zygomatic arch fracture appears well corticated and chronic, with no overlying soft tissue swelling. Sinuses/Orbits: The visualized paranasal sinuses are essentially clear. Other:  The mastoid air cells are unopacified. CT CERVICAL SPINE FINDINGS Alignment: Straightening of the cervical spine. No facet subluxation. Dens is well positioned between the lateral masses of C1. Mild 3 mm retrolisthesis at C5-6 and C6-7 and 3 mm anterolisthesis at C7-T1. Skull base and vertebrae: No acute fracture. No primary bone lesion or focal pathologic process. Soft tissues and spinal canal: No prevertebral edema. No visible canal hematoma. Disc levels: Severe multilevel degenerative disc disease throughout the cervical spine, most prominent at C3-4, C5-6 and C6-7. Moderate to severe bilateral cervical facet arthropathy. Moderate  degenerative foraminal stenosis bilaterally at C5-6. Upper chest: No acute abnormality. Other: Visualized mastoid air cells appear clear. No discrete thyroid nodules. No pathologically enlarged cervical nodes. IMPRESSION: 1. No evidence of acute intracranial abnormality. No evidence of calvarial fracture. 2. Mild chronic small vessel ischemic changes in the cerebral white matter. 3. Nondisplaced left zygomatic arch fracture appears well corticated and chronic, with no overlying soft tissue swelling. 4. No cervical spine fracture or facet subluxation. 5. 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 6. Mild multilevel cervical spondylolisthesis is probably degenerative. Electronically Signed   By: Delbert Phenix M.D.   On: 04/03/2019 11:56   DG Knee Complete 4  Views Right  Result Date: 04/03/2019 CLINICAL DATA:  Acute RIGHT knee pain following fall several days ago. Initial encounter. EXAM: RIGHT KNEE - COMPLETE 4+ VIEW COMPARISON:  None. FINDINGS: No acute fracture, subluxation or dislocation identified. Minimal tricompartmental joint space narrowing/osteophytosis noted. There is no evidence of joint effusion. No focal bony lesions are present. Heavy vascular calcifications are identified. IMPRESSION: 1. No evidence of acute abnormality. 2. Minimal tricompartmental degenerative changes. Electronically Signed   By: Margarette Canada M.D.   On: 04/03/2019 09:55    Procedures Procedures (including critical care time)  Medications Ordered in ED Medications  sodium chloride (PF) 0.9 % injection (has no administration in time range)  heparin bolus via infusion 2,500 Units (2,500 Units Intravenous Bolus from Bag 04/03/19 1339)    Followed by  heparin ADULT infusion 100 units/mL (25000 units/262mL sodium chloride 0.45%) (1,200 Units/hr Intravenous New Bag/Given 04/03/19 1340)  iohexol (OMNIPAQUE) 350 MG/ML injection 100 mL (100 mLs Intravenous Contrast Given 04/03/19 1128)    ED Course   I have reviewed the triage vital signs and the nursing notes.  Pertinent labs & imaging results that were available during my care of the patient were reviewed by me and considered in my medical decision making (see chart for details).    MDM Rules/Calculators/A&P                      Patient is a 86 year old male who presents with right-sided chest pain and shortness of breath.  He did have a recent fall but he did not have a report of injury to that side.  Given his fall, I did do a CT scan of his head and cervical spine which showed no acute abnormalities.  He had an x-ray of his right knee which shows no acute abnormalities.  He reported some weakness in the right leg.  He seems to have good strength on my exam and it seems that the weakness is related to his pain and muscle cramping in that leg.  He does not have any other symptoms concerning for stroke.  He is very tachypneic and tachycardic.  CT scan of his chest reveals large burden of left-sided PE.  There is evidence of right heart strain and pulmonary infarct as well.  He was started on heparin.  I placed him on oxygen for comfort due to his tachypnea although he is not hypoxic.  His blood pressures been stable.  I spoke with the pulmonary critical care team who is evaluated the patient and will admit the patient to the ICU for further treatment.  He was not deemed a candidate for TPA given his recent head trauma.  CRITICAL CARE Performed by: Malvin Johns Total critical care time: 60 minutes Critical care time was exclusive of separately billable procedures and treating other patients. Critical care was necessary to treat or prevent imminent or life-threatening deterioration. Critical care was time spent personally by me on the following activities: development of treatment plan with patient and/or surrogate as well as nursing, discussions with consultants, evaluation of patient's response to treatment, examination of patient, obtaining  history from patient or surrogate, ordering and performing treatments and interventions, ordering and review of laboratory studies, ordering and review of radiographic studies, pulse oximetry and re-evaluation of patient's condition.  Final Clinical Impression(s) / ED Diagnoses Final diagnoses:  Acute pulmonary embolism with acute cor pulmonale, unspecified pulmonary embolism type ([place])    Rx / DC Orders ED Discharge Orders  None       Rolan Bucco, MD 04/03/19 1402

## 2019-04-03 NOTE — ED Notes (Signed)
ED Provider at bedside. 

## 2019-04-04 ENCOUNTER — Other Ambulatory Visit: Payer: Self-pay

## 2019-04-04 LAB — CBC
HCT: 40.8 % (ref 39.0–52.0)
Hemoglobin: 12.8 g/dL — ABNORMAL LOW (ref 13.0–17.0)
MCH: 30.3 pg (ref 26.0–34.0)
MCHC: 31.4 g/dL (ref 30.0–36.0)
MCV: 96.7 fL (ref 80.0–100.0)
Platelets: 141 10*3/uL — ABNORMAL LOW (ref 150–400)
RBC: 4.22 MIL/uL (ref 4.22–5.81)
RDW: 13.6 % (ref 11.5–15.5)
WBC: 10 10*3/uL (ref 4.0–10.5)
nRBC: 0 % (ref 0.0–0.2)

## 2019-04-04 LAB — BASIC METABOLIC PANEL
Anion gap: 11 (ref 5–15)
BUN: 18 mg/dL (ref 8–23)
CO2: 25 mmol/L (ref 22–32)
Calcium: 8.6 mg/dL — ABNORMAL LOW (ref 8.9–10.3)
Chloride: 102 mmol/L (ref 98–111)
Creatinine, Ser: 0.96 mg/dL (ref 0.61–1.24)
GFR calc Af Amer: >60 mL/min (ref >60)
GFR calc non Af Amer: >60 mL/min (ref >60)
Glucose, Bld: 131 mg/dL — ABNORMAL HIGH (ref 70–99)
Potassium: 4.1 mmol/L (ref 3.5–5.1)
Sodium: 138 mmol/L (ref 135–145)

## 2019-04-04 LAB — HEPARIN LEVEL (UNFRACTIONATED)
Heparin Unfractionated: 0.35 IU/mL (ref 0.30–0.70)
Heparin Unfractionated: 0.31 IU/mL (ref 0.30–0.70)

## 2019-04-04 MED ORDER — LATANOPROST 0.005 % OP SOLN
1.0000 [drp] | Freq: Every day | OPHTHALMIC | Status: DC
  Administered 2019-04-04 – 2019-04-08 (×5): 1 [drp] via OPHTHALMIC
  Filled 2019-04-04 (×2): qty 2.5

## 2019-04-04 MED ORDER — METOPROLOL TARTRATE 5 MG/5ML IV SOLN
2.5000 mg | Freq: Four times a day (QID) | INTRAVENOUS | Status: DC | PRN
  Administered 2019-04-04 – 2019-04-05 (×3): 2.5 mg via INTRAVENOUS
  Filled 2019-04-04 (×4): qty 5

## 2019-04-04 MED ORDER — METOPROLOL TARTRATE 25 MG PO TABS
25.0000 mg | ORAL_TABLET | Freq: Two times a day (BID) | ORAL | Status: DC
  Administered 2019-04-04 – 2019-04-05 (×3): 25 mg via ORAL
  Filled 2019-04-04 (×3): qty 1

## 2019-04-04 NOTE — Progress Notes (Signed)
Pharmacy - IV heparin  Assessment:    Please see note from Perlie Gold, PharmD earlier today for full details.  Briefly, 86 y.o. male on IV heparin for new PE with RH strain   Most recent heparin level therapeutic but borderline low at 0.31 units/mL at 1300 units/hr  No bleeding or infusion issues per RN  Plan:   Will increase heparin to 1400 units/hr to target mid-upper range with RH strain present  Recheck heparin level in 8 hrs  Bernadene Person, PharmD, BCPS [phone] 04/04/2019, 6:48 PM

## 2019-04-04 NOTE — Progress Notes (Signed)
Informed of new change in rhythm  A. fib on EKG  Lopressor 2.5 IV ordered, as needed for heart rate greater than 110  Metoprolol 25 p.o. twice daily

## 2019-04-04 NOTE — Progress Notes (Signed)
Patient to transfer to 4E 1410, report given to receiving nurse, all questions answered at this time.  Pt. VSS with no s/s of distress noted.  Patient stable at transfer.

## 2019-04-04 NOTE — Progress Notes (Signed)
ANTICOAGULATION CONSULT NOTE - Follow Up Consult  Pharmacy Consult for Heparin Indication: pulmonary embolus  Allergies  Allergen Reactions  . Penicillins Anaphylaxis    Did it involve swelling of the face/tongue/throat, SOB, or low BP? Yes Did it involve sudden or severe rash/hives, skin peeling, or any reaction on the inside of your mouth or nose? Yes Did you need to seek medical attention at a hospital or doctor's office? Yes When did it last happen?Over 10 years ago If all above answers are "NO", may proceed with cephalosporin use.  . Sulfa Antibiotics Anaphylaxis and Rash  . Clindamycin Nausea Only  . Contrast Media  [Iodinated Diagnostic Agents] Nausea Only  . Econazole Nitrate Itching and Other (See Comments)    Burning  . Erythromycin Nausea Only  . Oxycodone Nausea And Vomiting    Patient Measurements: Height: 5\' 10"  (177.8 cm) Weight: 181 lb 7 oz (82.3 kg) IBW/kg (Calculated) : 73 Heparin Dosing Weight:   Vital Signs: Temp: 98.5 F (36.9 C) (03/13 2343) Temp Source: Oral (03/13 2343) BP: 133/110 (03/13 2300) Pulse Rate: 101 (03/13 2300)  Labs: Recent Labs    04/03/19 0917 04/03/19 1034 04/03/19 2238  HGB 14.3  --   --   HCT 44.0  --   --   PLT 127*  --   --   HEPARINUNFRC  --   --  0.20*  CREATININE 1.00  --   --   TROPONINIHS 369* 450*  --     Estimated Creatinine Clearance: 50.7 mL/min (by C-G formula based on SCr of 1 mg/dL).   Medications:  Infusions:  . heparin 1,200 Units/hr (04/03/19 1800)    Assessment: Patient with low heparin level.  No heparin issues per RN.  Goal of Therapy:  Heparin level 0.3-0.7 units/ml Monitor platelets by anticoagulation protocol: Yes   Plan:  Increase heparin to 1300 units/hr Recheck level at 0900  04/05/19 [place] 04/04/2019,12:03 AM

## 2019-04-04 NOTE — Progress Notes (Signed)
ANTICOAGULATION CONSULT NOTE   Pharmacy Consult for IV heparin  Indication: pulmonary embolus  Allergies  Allergen Reactions  . Penicillins Anaphylaxis    Did it involve swelling of the face/tongue/throat, SOB, or low BP? Yes Did it involve sudden or severe rash/hives, skin peeling, or any reaction on the inside of your mouth or nose? Yes Did you need to seek medical attention at a hospital or doctor's office? Yes When did it last happen?Over 10 years ago If all above answers are "NO", may proceed with cephalosporin use.  . Sulfa Antibiotics Anaphylaxis and Rash  . Clindamycin Nausea Only  . Contrast Media  [Iodinated Diagnostic Agents] Nausea Only  . Econazole Nitrate Itching and Other (See Comments)    Burning  . Erythromycin Nausea Only  . Oxycodone Nausea And Vomiting   Patient Measurements: Height: 5\' 10"  (177.8 cm) Weight: 181 lb 7 oz (82.3 kg) IBW/kg (Calculated) : 73 Heparin Dosing Weight: 79.4 kg  Vital Signs: Temp: 98.4 F (36.9 C) (03/14 0830) Temp Source: Oral (03/14 0830) BP: 113/61 (03/14 0814) Pulse Rate: 93 (03/14 0814)  Labs: Recent Labs    04/03/19 0917 04/03/19 1034 04/03/19 2238 04/04/19 0346 04/04/19 0840  HGB 14.3  --   --  12.8*  --   HCT 44.0  --   --  40.8  --   PLT 127*  --   --  141*  --   HEPARINUNFRC  --   --  0.20*  --  0.35  CREATININE 1.00  --   --  0.96  --   TROPONINIHS 369* 450*  --   --   --    Estimated Creatinine Clearance: 52.8 mL/min (by C-G formula based on SCr of 0.96 mg/dL).  Medical History: Past Medical History:  Diagnosis Date  . Glaucoma   . Kidney stones   . Medical history non-contributory    Medications:  Scheduled:  . Chlorhexidine Gluconate Cloth  6 each Topical Daily  . mouth rinse  15 mL Mouth Rinse BID   Infusions:  . heparin 1,300 Units/hr (04/04/19 0913)   Assessment: 86 yo male presented to ED with sharp pains in left chest area found to have PE worrisome for heart strain per CT exam. To  start IV heparin per Rx. Baseline labs drawn.  Heparin 2500 unit bolus, infusion at 1200 units/hr started 3/13 First Hep level below therapeutic range at 0.2 units/ml - rate increased to 1300 units/hr  Goal of Therapy:  Heparin level 0.3-0.7 units/ml Monitor platelets by anticoagulation protocol: Yes   Today, 04/04/2019 0900 Hep level = 0.35 units/ml, in therapeutic range x1 level Hgb dec 14.3 > 12.73m Plt 141 Plan transfer to floor   Plan:  Continue Heparin infusion at 1300 units/hr Recheck Hep level at 5pm Daily Hep level, CBC ordered  4m PharmD 04/04/2019,9:50 AM

## 2019-04-04 NOTE — Progress Notes (Signed)
   NAME:  Willie Cisneros, MRN:  [id number], DOB:  10/18/1926, LOS: 1 ADMISSION DATE:  04/03/2019, REFERRING MD: Dr. Fredderick Phenix, CHIEF COMPLAINT: Pulmonary embolism  Brief History   Elderly gentleman who presented with shortness of breath and chest discomfort. He recently had sustained a left foot strain for which he had a boot, he tripped on the boot and fell on March 7.  He did hit his head right side of his face, stated his head bounced off the concrete about twice.  There was no loss of consciousness. Shortness of breath with minimal exertion, left-sided chest pains He has had some pain in his right knee as well following the fall Unable to take deep breaths because of pain  Past Medical History  Glaucoma  Significant Hospital Events   CT scan of the chest showing PE, pulmonary infarct  Consults:  pccm  Procedures:  None  Significant Diagnostic Tests:  CT chest The examination is positive for pulmonary embolus with findings worrisome for right heart strain. Peripheral wedge-shaped opacity in left lower lobe is worrisome for a pulmonary infarct.  CT head IMPRESSION: 1. No evidence of acute intracranial abnormality. No evidence of calvarial fracture. 2. Mild chronic small vessel ischemic changes in the cerebral white matter. 3. Nondisplaced left zygomatic arch fracture appears well corticated and chronic, with no overlying soft tissue swelling. 4. No cervical spine fracture or facet subluxation. 5. 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 6. Mild multilevel cervical spondylolisthesis is probably degenerative.  Micro Data:  None  Antimicrobials:  None  Interim history/subjective:  Elderly gentleman, comfortable Denies any chest pains or discomfort today  Objective   Blood pressure 113/61, pulse 93, temperature 98.4 F (36.9 C), temperature source Oral, resp. rate (!) 26, height 5\' 10"  (1.778 m), weight 82.3 kg, SpO2 95 %.         Intake/Output Summary (Last 24 hours) at 04/04/2019 0957 Last data filed at 04/04/2019 0840 Gross per 24 hour  Intake 461.89 ml  Output 675 ml  Net -213.11 ml   Filed Weights   04/03/19 0837 04/03/19 1545  Weight: 79.4 kg 82.3 kg    Examination: General: Elderly gentleman, ecchymosis, periorbital edema on the right HENT: Moist oral mucosa Lungs: Decreased air movement secondary to poor effort, no rhonchi Cardiovascular: S1-S2 appreciated Abdomen: Sounds appreciated Extremities: Has a boot on the left foot  Resolved Hospital Problem list     Assessment & Plan:  Pulmonary embolism -Right heart strain on CT scan -Echo with mildly reduced right ventricular function -Not requiring oxygen -Normotensive -Continue anticoagulation with heparin -May be switched to DOAC  Pulmonary infarct -Related to pulmonary embolism -Does not appear to be having significant enough pain that he cannot tolerate, declines any pain medications  Recent facial trauma on March 7 with periorbital ecchymosis, periorbital edema Above makes him a poor candidate for thrombolytics As remaining with dynamically stable -Continue anticoagulation  Transfer to medical floor today  07-16-1981, MD [place], PCCM Cell: [phone]

## 2019-04-05 ENCOUNTER — Encounter (HOSPITAL_COMMUNITY): Payer: Self-pay | Admitting: Pulmonary Disease

## 2019-04-05 ENCOUNTER — Other Ambulatory Visit: Payer: Self-pay

## 2019-04-05 ENCOUNTER — Inpatient Hospital Stay (HOSPITAL_COMMUNITY): Payer: Medicare Other

## 2019-04-05 DIAGNOSIS — I4819 Other persistent atrial fibrillation: Secondary | ICD-10-CM

## 2019-04-05 DIAGNOSIS — I2699 Other pulmonary embolism without acute cor pulmonale: Secondary | ICD-10-CM

## 2019-04-05 LAB — CBC
HCT: 38.8 % — ABNORMAL LOW (ref 39.0–52.0)
Hemoglobin: 12.6 g/dL — ABNORMAL LOW (ref 13.0–17.0)
MCH: 30.9 pg (ref 26.0–34.0)
MCHC: 32.5 g/dL (ref 30.0–36.0)
MCV: 95.1 fL (ref 80.0–100.0)
Platelets: 144 10*3/uL — ABNORMAL LOW (ref 150–400)
RBC: 4.08 MIL/uL — ABNORMAL LOW (ref 4.22–5.81)
RDW: 13.4 % (ref 11.5–15.5)
WBC: 9.9 10*3/uL (ref 4.0–10.5)
nRBC: 0 % (ref 0.0–0.2)

## 2019-04-05 LAB — HEPARIN LEVEL (UNFRACTIONATED)
Heparin Unfractionated: 0.37 IU/mL (ref 0.30–0.70)
Heparin Unfractionated: 0.21 IU/mL — ABNORMAL LOW (ref 0.30–0.70)

## 2019-04-05 LAB — MAGNESIUM: Magnesium: 2.2 mg/dL (ref 1.7–2.4)

## 2019-04-05 LAB — TSH: TSH: 1.474 u[IU]/mL (ref 0.350–4.500)

## 2019-04-05 MED ORDER — HEPARIN (PORCINE) 25000 UT/250ML-% IV SOLN
1550.0000 [IU]/h | INTRAVENOUS | Status: DC
  Administered 2019-04-06: 1550 [IU]/h via INTRAVENOUS
  Filled 2019-04-05: qty 250

## 2019-04-05 NOTE — Consult Note (Addendum)
Cardiology Consultation:   Patient ID: Willie BackboneRichard H Cisneros MRN: [id number]; DOB: 05-04-1926  Admit date: 04/03/2019 Date of Consult: 04/05/2019  Primary Care Provider: Gweneth DimitriMcNeill, Wendy, MD Primary Cardiologist: New Primary Electrophysiologist:  None    Patient Profile:   Willie Backboneichard H Manna is a 86 y.o. male with a hx of skin cancer, glaucoma, possible TIA (over 10 years ago), shingles, and recent stress fracture left toe with post-op shoe who is being seen today for the evaluation of A. fib at the request of Dr. Sunnie Nielsenegalado.  History of Present Illness:   Willie Cisneros not been seen by cardiology before in the past.  No known history of MI, stent, heart failure, stroke, diabetes, hypertension, hyperlipidemia. Negative for cardiac history in his family. Patient is very active, walking 1.5 miles three times a week. At the beginning of march he walked an extra 3 miles over the weekend and started having left toe pain and was diagnosed with stress fracture and was put in a boot for 4 weeks. He does not see a PCP regularly. He sees a podiatrist and dermatologist regularly. Before his stress fx injury he denied chest pain, significant shortness of breath, or palpitations. He lives with his wife who is suffering from dementia which seems to be causing stress. No tobacco/alcohol/drug use.   The patient reportedly tripped over his right postop shoe 3/7 and fell on his right side hitting his right head. He went to get the mail and when he turned around he tripped and fell on his head.  No LOC. He did not get checked out at that time.  Patient presented 04/03/2019 with chest pain and shortness of breath. This started 3 days ago. It was left sided and sharp. The pain was 8/10 and constant and non-radiating. Was unable to take deep breaths due to the pain. Pain was worse with laying down. He called his son when the pain did not improve.   In the ED blood pressure 149/94, pulse 129, afebrile, respiratory rate 31, 100%  O2.  CT of the chest was positive for pulmonary embolism possible right heart strain.  CT of the head with no acute process. HS troponin 369>450. EKG showed aflutter 132 bpm.  The patient was admitted and started on IV heparin.  Echo showed EF of 40 to 45%, mild LVH, mildly reduced RV function, mildly dilated LA, trivial MR. Patient noted to be in A. fib RVR and cardiology was consulted.   Past Medical History:  Diagnosis Date  . Glaucoma   . Kidney stones   . Medical history non-contributory   . Shingles   . TIA (transient ischemic attack)     Past Surgical History:  Procedure Laterality Date  . NO PAST SURGERIES       Home Medications:  Prior to Admission medications   Medication Sig Start Date End Date Taking? Authorizing Provider  latanoprost (XALATAN) 0.005 % ophthalmic solution Place 1 drop into both eyes at bedtime.    Yes [provider]  Multiple Vitamins-Minerals (CENTRUM SILVER 50+MEN) TABS Take 1 tablet by mouth daily.   Yes [provider]    Inpatient Medications: Scheduled Meds: . Chlorhexidine Gluconate Cloth  6 each Topical Daily  . latanoprost  1 drop Both Eyes QHS  . mouth rinse  15 mL Mouth Rinse BID  . metoprolol tartrate  25 mg Oral BID   Continuous Infusions: . heparin 1,400 Units/hr (04/05/19 0452)   PRN Meds: metoprolol tartrate  Allergies:    Allergies  Allergen Reactions  . Penicillins Anaphylaxis    Did it involve swelling of the face/tongue/throat, SOB, or low BP? Yes Did it involve sudden or severe rash/hives, skin peeling, or any reaction on the inside of your mouth or nose? Yes Did you need to seek medical attention at a hospital or doctor's office? Yes When did it last happen?Over 10 years ago If all above answers are "NO", may proceed with cephalosporin use.  . Sulfa Antibiotics Anaphylaxis and Rash  . Clindamycin Nausea Only  . Contrast Media  [Iodinated Diagnostic Agents] Nausea Only  . Econazole Nitrate  Itching and Other (See Comments)    Burning  . Erythromycin Nausea Only  . Oxycodone Nausea And Vomiting    Social History:   Social History   Socioeconomic History  . Marital status: Married    Spouse name: Chancy Smigiel  . Number of children: Not on file  . Years of education: Not on file  . Highest education level: Not on file  Occupational History  . Not on file  Tobacco Use  . Smoking status: Never Smoker  . Smokeless tobacco: Never Used  Substance and Sexual Activity  . Alcohol use: Not Currently  . Drug use: Never  . Sexual activity: Yes  Other Topics Concern  . Not on file  Social History Narrative  . Not on file   Social Determinants of Health   Financial Resource Strain:   . Difficulty of Paying Living Expenses:   Food Insecurity:   . Worried About Programme researcher, broadcasting/film/video in the Last Year:   . Barista in the Last Year:   Transportation Needs:   . Freight forwarder (Medical):   Marland Kitchen Lack of Transportation (Non-Medical):   Physical Activity:   . Days of Exercise per Week:   . Minutes of Exercise per Session:   Stress:   . Feeling of Stress :   Social Connections:   . Frequency of Communication with Friends and Family:   . Frequency of Social Gatherings with Friends and Family:   . Attends Religious Services:   . Active Member of Clubs or Organizations:   . Attends Banker Meetings:   Marland Kitchen Marital Status:   Intimate Partner Violence:   . Fear of Current or Ex-Partner:   . Emotionally Abused:   Marland Kitchen Physically Abused:   . Sexually Abused:     Family History:   Family History  Problem Relation Age of Onset  . Heart disease Neg Hx      ROS:  Please see the history of present illness.  All other ROS reviewed and negative.     Physical Exam/Data:   Vitals:   04/04/19 2041 04/05/19 0450 04/05/19 0452 04/05/19 1026  BP: 117/78 115/78  116/66  Pulse: (!) 108 (!) 102  (!) 104  Resp: 18 18    Temp: 98.8 F (37.1 C) 98.3 F (36.8  C)    TempSrc:  Oral    SpO2: 98% 97%    Weight:   82.8 kg   Height:        Intake/Output Summary (Last 24 hours) at 04/05/2019 1135 Last data filed at 04/05/2019 0454 Gross per 24 hour  Intake 100.51 ml  Output 800 ml  Net -699.49 ml   Last 3 Weights 04/05/2019 04/03/2019 04/03/2019  Weight (lbs) 182 lb 8.7 oz 181 lb 7 oz 175 lb  Weight (kg) 82.8 kg 82.3 kg 79.379 kg     Body mass index  is 26.19 kg/m.  General:  Well nourished, well developed, in no acute distress HEENT: normal Lymph: no adenopathy Neck: no JVD Endocrine:  No thryomegaly Vascular: No carotid bruits; FA pulses 2+ bilaterally without bruits  Cardiac:  normal S1, S2; Irreg Irreg; no murmur  Lungs:  clear to auscultation bilaterally, no wheezing, rhonchi or rales  Abd: soft, nontender, no hepatomegaly  Ext: no edema Musculoskeletal:  No deformities, BUE and BLE strength normal and equal Skin: warm and dry  Neuro:  CNs 2-12 intact, no focal abnormalities noted Psych:  Normal affect   EKG:  The EKG was personally reviewed and demonstrates: A. fib RVR 107 bpm, no ST-T wave changes Telemetry:  Telemetry was personally reviewed and demonstrates:  Afib, HR 100-110, PVCs  Relevant CV Studies:  Echo 1. Left ventricular ejection fraction, by estimation, is 40 to 45% with  beat to beat variability. The left ventricle has mildly decreased  function. Left ventricular endocardial border not optimally defined to  evaluate regional wall motion. There is  moderate left ventricular hypertrophy.  2. Right ventricular systolic function is mildly reduced. The right  ventricular size is normal. Tricuspid regurgitation signal is inadequate  for assessing PA pressure.  3. Left atrial size was mildly dilated.  4. The mitral valve is normal in structure. Trivial mitral valve  regurgitation. No evidence of mitral stenosis.  5. The aortic valve is abnormal. Aortic valve regurgitation is not  visualized. No aortic stenosis is  present.  6. Aortic dilatation noted. There is borderline dilatation of the  ascending aorta measuring 39 mm.  7. The inferior vena cava is normal in size with greater than 50%  respiratory variability, suggesting right atrial pressure of 3 mmHg.   Laboratory Data:  High Sensitivity Troponin:   Recent Labs  Lab 04/03/19 0917 04/03/19 1034  TROPONINIHS 369* 450*     Chemistry Recent Labs  Lab 04/03/19 0917 04/04/19 0346  NA 138 138  K 4.6 4.1  CL 104 102  CO2 24 25  GLUCOSE 146* 131*  BUN 17 18  CREATININE 1.00 0.96  CALCIUM 9.0 8.6*  GFRNONAA >60 >60  GFRAA >60 >60  ANIONGAP 10 11    No results for input(s): PROT, ALBUMIN, AST, ALT, ALKPHOS, BILITOT in the last 168 hours. Hematology Recent Labs  Lab 04/03/19 0917 04/04/19 0346 04/05/19 0451  WBC 12.0* 10.0 9.9  RBC 4.59 4.22 4.08*  HGB 14.3 12.8* 12.6*  HCT 44.0 40.8 38.8*  MCV 95.9 96.7 95.1  MCH 31.2 30.3 30.9  MCHC 32.5 31.4 32.5  RDW 13.5 13.6 13.4  PLT 127* 141* 144*   BNP Recent Labs  Lab 04/03/19 0926  BNP 125.9*    DDimer No results for input(s): DDIMER in the last 168 hours.   Radiology/Studies:  DG Chest 2 View  Result Date: 04/03/2019 CLINICAL DATA:  Left chest pain EXAM: CHEST - 2 VIEW COMPARISON:  None. FINDINGS: Mild blunting left costophrenic angle, favored to reflect epicardial fat, clear on the lateral view. Right lung is clear. No pleural effusion or pneumothorax. The heart is top-normal in size. Visualized osseous structures are within normal limits. IMPRESSION: Normal chest radiographs. Electronically Signed   By: Charline Bills M.D.   On: 04/03/2019 09:22   CT Head Wo Contrast  Result Date: 04/03/2019 CLINICAL DATA:  Fall a few days prior. Right orbital bruise. Posterior neck pain. EXAM: CT HEAD WITHOUT CONTRAST CT CERVICAL SPINE WITHOUT CONTRAST TECHNIQUE: Multidetector CT imaging of the head and cervical spine  was performed following the standard protocol without intravenous  contrast. Multiplanar CT image reconstructions of the cervical spine were also generated. COMPARISON:  None. FINDINGS: CT HEAD FINDINGS Brain: No evidence of parenchymal hemorrhage or extra-axial fluid collection. No mass lesion, mass effect, or midline shift. No CT evidence of acute infarction. Nonspecific mild subcortical and periventricular white matter hypodensity, most in keeping with chronic small vessel ischemic change. Cerebral volume is age appropriate. No ventriculomegaly. Vascular: No acute abnormality. Skull: No evidence of calvarial fracture. Nondisplaced left zygomatic arch fracture appears well corticated and chronic, with no overlying soft tissue swelling. Sinuses/Orbits: The visualized paranasal sinuses are essentially clear. Other:  The mastoid air cells are unopacified. CT CERVICAL SPINE FINDINGS Alignment: Straightening of the cervical spine. No facet subluxation. Dens is well positioned between the lateral masses of C1. Mild 3 mm retrolisthesis at C5-6 and C6-7 and 3 mm anterolisthesis at C7-T1. Skull base and vertebrae: No acute fracture. No primary bone lesion or focal pathologic process. Soft tissues and spinal canal: No prevertebral edema. No visible canal hematoma. Disc levels: Severe multilevel degenerative disc disease throughout the cervical spine, most prominent at C3-4, C5-6 and C6-7. Moderate to severe bilateral cervical facet arthropathy. Moderate degenerative foraminal stenosis bilaterally at C5-6. Upper chest: No acute abnormality. Other: Visualized mastoid air cells appear clear. No discrete thyroid nodules. No pathologically enlarged cervical nodes. IMPRESSION: 1. No evidence of acute intracranial abnormality. No evidence of calvarial fracture. 2. Mild chronic small vessel ischemic changes in the cerebral white matter. 3. Nondisplaced left zygomatic arch fracture appears well corticated and chronic, with no overlying soft tissue swelling. 4. No cervical spine fracture or facet  subluxation. 5. 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 6. Mild multilevel cervical spondylolisthesis is probably degenerative. Electronically Signed   By: Delbert Phenix M.D.   On: 04/03/2019 11:56   CT Angio Chest PE W and/or Wo Contrast  Result Date: 04/03/2019 CLINICAL DATA:  Severe left chest pain today. EXAM: CT ANGIOGRAPHY CHEST WITH CONTRAST TECHNIQUE: Multidetector CT imaging of the chest was performed using the standard protocol during bolus administration of intravenous contrast. Multiplanar CT image reconstructions and MIPs were obtained to evaluate the vascular anatomy. CONTRAST:  100 mL OMNIPAQUE IOHEXOL 350 MG/ML SOLN COMPARISON:  PA and lateral chest today. FINDINGS: Cardiovascular: The examination is positive for pulmonary embolus. Clot is best seen in the left main pulmonary artery and its branches to both the upper and lower lobes. The RV to LV ratio is 1.22 worrisome for right heart strain. Cardiomegaly and calcific aortic and coronary atherosclerosis are noted. No pericardial effusion. Thoracic aorta is normal in caliber. Mediastinum/Nodes: No enlarged mediastinal, hilar, or axillary lymph nodes. Thyroid gland, trachea, and esophagus demonstrate no significant findings. Lungs/Pleura: Very small left pleural effusion is seen. Wedge-shaped peripheral opacity in the left lower lobe is worrisome for a pulmonary infarct. Dependent atelectasis is noted. Upper Abdomen: Single, small nonobstructing stones are seen in each kidney Musculoskeletal: No fracture or worrisome lesion. Multilevel spondylosis and mild scoliosis noted. Review of the MIP images confirms the above findings. IMPRESSION: The examination is positive for pulmonary embolus with findings worrisome for right heart strain. Peripheral wedge-shaped opacity in left lower lobe is worrisome for a pulmonary infarct. Cardiomegaly. Critical Value/emergent results were called by telephone at the time of  interpretation on 04/03/2019 at 11:52 am to provider MELANIE BELFI , who verbally acknowledged these results. Aortic Atherosclerosis (ICD10-I70.0). Calcific coronary artery disease also noted. Electronically Signed  By: Drusilla Kanner M.D.   On: 04/03/2019 11:55   CT Cervical Spine Wo Contrast  Result Date: 04/03/2019 CLINICAL DATA:  Fall a few days prior. Right orbital bruise. Posterior neck pain. EXAM: CT HEAD WITHOUT CONTRAST CT CERVICAL SPINE WITHOUT CONTRAST TECHNIQUE: Multidetector CT imaging of the head and cervical spine was performed following the standard protocol without intravenous contrast. Multiplanar CT image reconstructions of the cervical spine were also generated. COMPARISON:  None. FINDINGS: CT HEAD FINDINGS Brain: No evidence of parenchymal hemorrhage or extra-axial fluid collection. No mass lesion, mass effect, or midline shift. No CT evidence of acute infarction. Nonspecific mild subcortical and periventricular white matter hypodensity, most in keeping with chronic small vessel ischemic change. Cerebral volume is age appropriate. No ventriculomegaly. Vascular: No acute abnormality. Skull: No evidence of calvarial fracture. Nondisplaced left zygomatic arch fracture appears well corticated and chronic, with no overlying soft tissue swelling. Sinuses/Orbits: The visualized paranasal sinuses are essentially clear. Other:  The mastoid air cells are unopacified. CT CERVICAL SPINE FINDINGS Alignment: Straightening of the cervical spine. No facet subluxation. Dens is well positioned between the lateral masses of C1. Mild 3 mm retrolisthesis at C5-6 and C6-7 and 3 mm anterolisthesis at C7-T1. Skull base and vertebrae: No acute fracture. No primary bone lesion or focal pathologic process. Soft tissues and spinal canal: No prevertebral edema. No visible canal hematoma. Disc levels: Severe multilevel degenerative disc disease throughout the cervical spine, most prominent at C3-4, C5-6 and C6-7.  Moderate to severe bilateral cervical facet arthropathy. Moderate degenerative foraminal stenosis bilaterally at C5-6. Upper chest: No acute abnormality. Other: Visualized mastoid air cells appear clear. No discrete thyroid nodules. No pathologically enlarged cervical nodes. IMPRESSION: 1. No evidence of acute intracranial abnormality. No evidence of calvarial fracture. 2. Mild chronic small vessel ischemic changes in the cerebral white matter. 3. Nondisplaced left zygomatic arch fracture appears well corticated and chronic, with no overlying soft tissue swelling. 4. No cervical spine fracture or facet subluxation. 5. 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 6. Mild multilevel cervical spondylolisthesis is probably degenerative. Electronically Signed   By: Delbert Phenix M.D.   On: 04/03/2019 11:56   DG Knee Complete 4 Views Right  Result Date: 04/03/2019 CLINICAL DATA:  Acute RIGHT knee pain following fall several days ago. Initial encounter. EXAM: RIGHT KNEE - COMPLETE 4+ VIEW COMPARISON:  None. FINDINGS: No acute fracture, subluxation or dislocation identified. Minimal tricompartmental joint space narrowing/osteophytosis noted. There is no evidence of joint effusion. No focal bony lesions are present. Heavy vascular calcifications are identified. IMPRESSION: 1. No evidence of acute abnormality. 2. Minimal tricompartmental degenerative changes. Electronically Signed   By: Harmon Pier M.D.   On: 04/03/2019 09:55   ECHOCARDIOGRAM COMPLETE  Result Date: 04/03/2019    ECHOCARDIOGRAM REPORT   Patient Name:   Willie Cisneros Date of Exam: 04/03/2019 Medical Rec #:  [id number]        Height:       69.0 in Accession #:    [id number]       Weight:       175.0 lb Date of Birth:  12-Jan-1927        BSA:          1.952 m Patient Age:    89 years         BP:           126/68 mmHg Patient Gender: M  HR:           120 bpm. Exam Location:  Inpatient Procedure: 2D Echo, Color  Doppler and Cardiac Doppler STAT ECHO Indications:    I26.02 Pulmonary embolus  History:        Patient has no prior history of Echocardiogram examinations.  Sonographer:    Irving Burton Senior RDCS Referring Phys: [id number] Tomma Lightning  Sonographer Comments: Technically difficult study due to poor echo windows and irregular rhythm. IMPRESSIONS  1. Left ventricular ejection fraction, by estimation, is 40 to 45% with beat to beat variability. The left ventricle has mildly decreased function. Left ventricular endocardial border not optimally defined to evaluate regional wall motion. There is moderate left ventricular hypertrophy.  2. Right ventricular systolic function is mildly reduced. The right ventricular size is normal. Tricuspid regurgitation signal is inadequate for assessing PA pressure.  3. Left atrial size was mildly dilated.  4. The mitral valve is normal in structure. Trivial mitral valve regurgitation. No evidence of mitral stenosis.  5. The aortic valve is abnormal. Aortic valve regurgitation is not visualized. No aortic stenosis is present.  6. Aortic dilatation noted. There is borderline dilatation of the ascending aorta measuring 39 mm.  7. The inferior vena cava is normal in size with greater than 50% respiratory variability, suggesting right atrial pressure of 3 mmHg. FINDINGS  Left Ventricle: Left ventricular ejection fraction, by estimation, is 40 to 45%. The left ventricle has mildly decreased function. Left ventricular endocardial border not optimally defined to evaluate regional wall motion. The left ventricular internal cavity size was normal in size. There is moderate left ventricular hypertrophy. Left ventricular diastolic parameters are indeterminate. Right Ventricle: The right ventricular size is normal. Right vetricular wall thickness was not assessed. Right ventricular systolic function is mildly reduced. Tricuspid regurgitation signal is inadequate for assessing PA pressure. Left Atrium:  Left atrial size was mildly dilated. Right Atrium: Right atrial size was normal in size. Pericardium: There is no evidence of pericardial effusion. Mitral Valve: The mitral valve is normal in structure. Normal mobility of the mitral valve leaflets. Trivial mitral valve regurgitation. No evidence of mitral valve stenosis. Tricuspid Valve: The tricuspid valve is normal in structure. Tricuspid valve regurgitation is trivial. No evidence of tricuspid stenosis. Aortic Valve: The aortic valve is abnormal. Aortic valve regurgitation is not visualized. No aortic stenosis is present. There is mild calcification of the aortic valve. Pulmonic Valve: The pulmonic valve was normal in structure. Pulmonic valve regurgitation is not visualized. No evidence of pulmonic stenosis. Aorta: Aortic dilatation noted. There is borderline dilatation of the ascending aorta measuring 39 mm. Venous: The inferior vena cava is normal in size with greater than 50% respiratory variability, suggesting right atrial pressure of 3 mmHg. IAS/Shunts: No atrial level shunt detected by color flow Doppler.  LEFT VENTRICLE PLAX 2D LVIDd:         4.60 cm LVIDs:         3.80 cm LV PW:         1.40 cm LV IVS:        1.50 cm LVOT diam:     2.00 cm LV SV:         38 LV SV Index:   20 LVOT Area:     3.14 cm  RIGHT VENTRICLE RV S prime:     11.00 cm/s TAPSE (M-mode): 1.6 cm LEFT ATRIUM             Index  RIGHT ATRIUM           Index LA diam:        4.20 cm 2.15 cm/m  RA Area:     16.90 cm LA Vol (A2C):   68.4 ml 35.04 ml/m RA Volume:   36.20 ml  18.54 ml/m LA Vol (A4C):   75.3 ml 38.58 ml/m LA Biplane Vol: 73.8 ml 37.81 ml/m  AORTIC VALVE LVOT Vmax:   75.53 cm/s LVOT Vmean:  55.467 cm/s LVOT VTI:    0.122 m                          PULMONARY ARTERY AORTA                    MPA diam:        2.30 cm Ao Root diam: 2.70 cm Ao Asc diam:  3.70 cm  SHUNTS Systemic VTI:  0.12 m Systemic Diam: 2.00 cm Weston Brass MD Electronically signed by Weston Brass  MD Signature Date/Time: 04/03/2019/3:13:54 PM    Final    {  Assessment and Plan:   New onset A. Fib Admitted 3/13 with chest pain shortness of breath found to have pulmonary embolism started on IV heparin.  Now noted to have A. fib with RVR rate of 107. - New onset Afib unsure of duration. No prior h/o.  - CHADSVASC = 3 (age x 2, CHF) - On IV heparin for PE - check TSH and Mag - Potassium 4.1 - Echo showed mildly reduced EF of 40 to 45% mildly dilated LA - He was started on Lopressor 25 mg twice daily for rate control.  - Patient remains in Afib with rates around 100bpm - Patient will need anticoagulation with DOAC for long-term  Pulmonary embolism/pulmonary infarct - Per CT, also with possible right heart strain - Echo showed mildly reduced RV function - 2L O2 - CP improved to 3/10 - Anticoagulation with IV heparin>> Plan to transition to oral  Cardiomyopathy -EF low normal 40 to 45% -In the setting of pulmonary embolism and A. fib RVR -Beta blocker started   For questions or updates, please contact [facility] Please consult [email or web address] for contact info under     Signed, Cadence David Stall, PA-C  04/05/2019 11:35 AM   As above, patient seen and examined.  Briefly he is a 86 year old male with past medical history of nephrolithiasis for evaluation of recent pulmonary embolus, question cardiomyopathy and atrial fibrillation. Pt recently had boot placed secondary to stress fracture in his foot.  He has been somewhat inactive for 4 weeks.  He then developed pain in his chest described as a sharp pain that increased with inspiration.  Also with dyspnea.  He was admitted and found to have a pulmonary embolus.  Also had atrial fibrillation with rapid ventricular response.  Echocardiogram interpreted as ejection fraction 40 to 45%.  Cardiology now asked to evaluate.  Chest CT shows pulmonary embolus. Troponin 450.  Electrocardiogram shows atrial flutter. Echo personally reviewed  and shows probable ejection fraction 50 to 55% though images are technically difficult.  Mild left atrial enlargement and moderate left ventricular hypertrophy.  1 recent pulmonary embolus-likely secondary to recent lower extremity immobilization.  I agree with anticoagulation.  He will need apixaban at discharge.  2 atrial flutter/atrial fibrillation-patient is noted to have atrial arrhythmias on telemetry. CHADSvasc 2.  This may be secondary to his recent pulmonary embolus though difficult to assess. I  agree with metoprolol and will advance as needed. Check TSH. Apixaban at DC. Given that he is asymptomatic, will plan rate control for now; can consider DCCV in the future if needed.  3 ? Cardiomyopathy-I personally reviewed echo; images technically difficult; EF appears to be in the 50-55 range. Will not pursue further evaluation.   4 Elevated troponin-likely related to pulmonary embolus; no ischemic symptoms; no plans for further cardiac eval.   Kirk Ruths

## 2019-04-05 NOTE — Progress Notes (Signed)
ANTICOAGULATION CONSULT NOTE - Follow Up Consult  Pharmacy Consult for heparin Indication: acute pulmonary embolus  Allergies  Allergen Reactions  . Penicillins Anaphylaxis    Did it involve swelling of the face/tongue/throat, SOB, or low BP? Yes Did it involve sudden or severe rash/hives, skin peeling, or any reaction on the inside of your mouth or nose? Yes Did you need to seek medical attention at a hospital or doctor's office? Yes When did it last happen?Over 10 years ago If all above answers are "NO", may proceed with cephalosporin use.  . Sulfa Antibiotics Anaphylaxis and Rash  . Clindamycin Nausea Only  . Contrast Media  [Iodinated Diagnostic Agents] Nausea Only  . Econazole Nitrate Itching and Other (See Comments)    Burning  . Erythromycin Nausea Only  . Oxycodone Nausea And Vomiting    Patient Measurements: Height: 5\' 10"  (177.8 cm) Weight: 182 lb 8.7 oz (82.8 kg) IBW/kg (Calculated) : 73 Heparin Dosing Weight: 83 kg  Vital Signs: Temp: 98.3 F (36.8 C) (03/15 0450) Temp Source: Oral (03/15 0450) BP: 116/66 (03/15 1026) Pulse Rate: 104 (03/15 1026)  Labs: Recent Labs     0000 04/03/19 0917 04/03/19 1034 04/03/19 2238 04/04/19 0346 04/04/19 0840 04/04/19 1718 04/05/19 0451  HGB   < > 14.3  --   --  12.8*  --   --  12.6*  HCT  --  44.0  --   --  40.8  --   --  38.8*  PLT  --  127*  --   --  141*  --   --  144*  HEPARINUNFRC  --   --   --    < >  --  0.35 0.31 0.37  CREATININE  --  1.00  --   --  0.96  --   --   --   TROPONINIHS  --  369* 450*  --   --   --   --   --    < > = values in this interval not displayed.    Estimated Creatinine Clearance: 52.8 mL/min (by C-G formula based on SCr of 0.96 mg/dL).   Assessment: Patient's a 86 y.o M presented to the ED on 3/13 with c/o CP and SOB. Chest CTA showed acute PE with concern for right heart strain. He's currently on heparin drip for PE.  Today, 04/05/2019: - heparin level remains therapeutic at  0.37 - cbc stable - no bleeding documented   Goal of Therapy:  Heparin level 0.3-0.7 units/ml Monitor platelets by anticoagulation protocol: Yes   Plan:  - continue heparin drip at 1400 units/hr - daily heparin level - monitor for s/s bleeding   Willie Cisneros P 04/05/2019,11:14 AM

## 2019-04-05 NOTE — Progress Notes (Signed)
Pt disconnected from heparing gtt at 2:06 for MRI. Pharmacist is aware.

## 2019-04-05 NOTE — Progress Notes (Signed)
ANTICOAGULATION CONSULT NOTE - Follow Up Consult  Pharmacy Consult for heparin Indication: acute pulmonary embolus  Allergies  Allergen Reactions  . Penicillins Anaphylaxis    Did it involve swelling of the face/tongue/throat, SOB, or low BP? Yes Did it involve sudden or severe rash/hives, skin peeling, or any reaction on the inside of your mouth or nose? Yes Did you need to seek medical attention at a hospital or doctor's office? Yes When did it last happen?Over 10 years ago If all above answers are "NO", may proceed with cephalosporin use.  . Sulfa Antibiotics Anaphylaxis and Rash  . Clindamycin Nausea Only  . Contrast Media  [Iodinated Diagnostic Agents] Nausea Only  . Econazole Nitrate Itching and Other (See Comments)    Burning  . Erythromycin Nausea Only  . Oxycodone Nausea And Vomiting    Patient Measurements: Height: 5\' 10"  (177.8 cm) Weight: 182 lb 8.7 oz (82.8 kg) IBW/kg (Calculated) : 73 Heparin Dosing Weight: 83 kg  Vital Signs: Temp: 98.2 F (36.8 C) (03/15 1211) Temp Source: Oral (03/15 1211) BP: 105/74 (03/15 1211) Pulse Rate: 79 (03/15 1211)  Labs: Recent Labs     0000 04/03/19 0917 04/03/19 1034 04/03/19 2238 04/04/19 0346 04/04/19 0840 04/04/19 1718 04/05/19 0451  HGB   < > 14.3  --   --  12.8*  --   --  12.6*  HCT  --  44.0  --   --  40.8  --   --  38.8*  PLT  --  127*  --   --  141*  --   --  144*  HEPARINUNFRC  --   --   --    < >  --  0.35 0.31 0.37  CREATININE  --  1.00  --   --  0.96  --   --   --   TROPONINIHS  --  369* 450*  --   --   --   --   --    < > = values in this interval not displayed.    Estimated Creatinine Clearance: 52.8 mL/min (by C-G formula based on SCr of 0.96 mg/dL).   Assessment: Patient's a 86 y.o M presented to the ED on 3/13 with c/o CP and SOB. Chest CTA showed acute PE with concern for right heart strain. He's currently on heparin drip for PE.  Today, 04/05/2019: - heparin level remains therapeutic at  0.37 - cbc stable - no bleeding documented  * Heparin off for MRI from 2p-3p   Goal of Therapy:  Heparin level 0.3-0.7 units/ml Monitor platelets by anticoagulation protocol: Yes   Plan:  - Resume heparin drip at 1400 units/hr (~3p) - check 6 hr Hep level - daily heparin level - monitor for s/s bleeding   04/07/2019 PharmD 04/05/2019,3:17 PM

## 2019-04-05 NOTE — Progress Notes (Signed)
Heparin gtt restarted at 2:58. RN made pharmacist aware.

## 2019-04-05 NOTE — Progress Notes (Signed)
PROGRESS NOTE    Willie Cisneros  DXI:[id number] DOB: Jan 05, 1927 DOA: 04/03/2019 PCP: Cari Caraway, MD   Brief Narrative: 86 year old with past medical history significant for glaucoma who presented complaining of cough worsening shortness of breath and chest pain.  He recently had sustained a left foot strain for which he had a boot, he tripped on the boot and fell on March 7.  He hit his head and his face. He was found to have pulmonary embolus worrisome of for right heart strain.  And pulmonary infarct.  He was admitted to the critical care service and he was a started on heparin drip.  Vitals has remained stable and his  Oxygen saturation  has remained stable.   Assessment & Plan:   Active Problems:   Pulmonary embolism (HCC)  1-Pulmonary embolism ; with right side heart strain.  Continue with heparin Gtt for another 24 hours.  Plan to transition to [place].  Continue to monitor vitals.   2-Right LE weakness;  MRI brain negative for stroke.  MRI right thigh negative for tendon rupture.  Will proceed with MRI knee.   3-A fib RVR, new Cardiomathy, Ef 45 %; Continue with metoprolol.  Already on Heparin  Cardiology consult for new cardiomyopathy.   4-Elevation troponin; in setting of PE>   5-Recent fall, facial trauma.  PT evaluation after getting MRI   Estimated body mass index is 26.19 kg/m as calculated from the following:   Height as of this encounter: 5\' 10"  (1.778 m).   Weight as of this encounter: 82.8 kg.   DVT prophylaxis: Heparin  Code Status: Full code Family Communication: Care discussed with patient was able to understand plan of care Disposition Plan:  Patient is from: Home Anticipated d/c date: 2 to 3 days, need PT eval, continue evaluation for right knee pain and right leg weakness Barriers to d/c or necessity for inpatient status: Continue IV heparin for PE with right-sided heart strain high risk for decompensation  Consultants:   [facility] Cardiology  Procedures:   Echo: Ejection fraction 40 to 45% decreased biventricular function  Antimicrobials:    Subjective: Patient report chest pain and shortness of breath has improved.  Chest pain on and off less severe and less frequent.  His biggest concern today is right knee pain and right leg weakness.  He has not been able to move his right leg since his been in the hospital.  He reports pain behind his thigh.    Objective: Vitals:   04/04/19 1844 04/04/19 2041 04/05/19 0450 04/05/19 0452  BP: (!) 140/92 117/78 115/78   Pulse: (!) 107 (!) 108 (!) 102   Resp: 20 18 18    Temp: 99.5 F (37.5 C) 98.8 F (37.1 C) 98.3 F (36.8 C)   TempSrc: Oral  Oral   SpO2: 97% 98% 97%   Weight:    82.8 kg  Height:        Intake/Output Summary (Last 24 hours) at 04/05/2019 1026 Last data filed at 04/05/2019 0454 Gross per 24 hour  Intake 100.51 ml  Output 800 ml  Net -699.49 ml   Filed Weights   04/03/19 0837 04/03/19 1545 04/05/19 0452  Weight: 79.4 kg 82.3 kg 82.8 kg    Examination:  General exam: NAD Respiratory system: Clear to auscultation. Respiratory effort normal. Cardiovascular system: S1 & S2 heard, RRR.  Gastrointestinal system: Abdomen is nondistended, soft and nontender. No organomegaly or masses felt. Normal bowel sounds heard. Central nervous system: Alert and  oriented.  Extremities: Right edema    Data Reviewed: I have personally reviewed following labs and imaging studies  CBC: Recent Labs  Lab 04/03/19 0917 04/04/19 0346 04/05/19 0451  WBC 12.0* 10.0 9.9  HGB 14.3 12.8* 12.6*  HCT 44.0 40.8 38.8*  MCV 95.9 96.7 95.1  PLT 127* 141* 144*   Basic Metabolic Panel: Recent Labs  Lab 04/03/19 0917 04/04/19 0346  NA 138 138  K 4.6 4.1  CL 104 102  CO2 24 25  GLUCOSE 146* 131*  BUN 17 18  CREATININE 1.00 0.96  CALCIUM 9.0 8.6*   GFR: Estimated Creatinine Clearance: 52.8 mL/min (by C-G formula based on SCr of 0.96 mg/dL). Liver  Function Tests: No results for input(s): AST, ALT, ALKPHOS, BILITOT, PROT, ALBUMIN in the last 168 hours. No results for input(s): LIPASE, AMYLASE in the last 168 hours. No results for input(s): AMMONIA in the last 168 hours. Coagulation Profile: No results for input(s): INR, PROTIME in the last 168 hours. Cardiac Enzymes: No results for input(s): CKTOTAL, CKMB, CKMBINDEX, TROPONINI in the last 168 hours. BNP (last 3 results) No results for input(s): PROBNP in the last 8760 hours. HbA1C: No results for input(s): HGBA1C in the last 72 hours. CBG: No results for input(s): GLUCAP in the last 168 hours. Lipid Profile: No results for input(s): CHOL, HDL, LDLCALC, TRIG, CHOLHDL, LDLDIRECT in the last 72 hours. Thyroid Function Tests: No results for input(s): TSH, T4TOTAL, FREET4, T3FREE, THYROIDAB in the last 72 hours. Anemia Panel: No results for input(s): VITAMINB12, FOLATE, FERRITIN, TIBC, IRON, RETICCTPCT in the last 72 hours. Sepsis Labs: No results for input(s): PROCALCITON, LATICACIDVEN in the last 168 hours.  Recent Results (from the past 240 hour(s))  Respiratory Panel by RT PCR (Flu A&B, Covid) - Nasopharyngeal Swab     Status: None   Collection Time: 04/03/19 12:09 PM   Specimen: Nasopharyngeal Swab  Result Value Ref Range Status   SARS Coronavirus 2 by RT PCR NEGATIVE NEGATIVE Final    Comment: (NOTE) SARS-CoV-2 target nucleic acids are NOT DETECTED. The SARS-CoV-2 RNA is generally detectable in upper respiratoy specimens during the acute phase of infection. The lowest concentration of SARS-CoV-2 viral copies this assay can detect is 131 copies/mL. A negative result does not preclude SARS-Cov-2 infection and should not be used as the sole basis for treatment or other patient management decisions. A negative result may occur with  improper specimen collection/handling, submission of specimen other than nasopharyngeal swab, presence of viral mutation(s) within the areas  targeted by this assay, and inadequate number of viral copies (<131 copies/mL). A negative result must be combined with clinical observations, patient history, and epidemiological information. The expected result is Negative. Fact Sheet for Patients:  https://www.moore.com/ Fact Sheet for Healthcare Providers:  https://www.young.biz/ This test is not yet ap proved or cleared by the [place] FDA and  has been authorized for detection and/or diagnosis of SARS-CoV-2 by FDA under an Emergency Use Authorization (EUA). This EUA will remain  in effect (meaning this test can be used) for the duration of the COVID-19 declaration under Section 564(b)(1) of the Act, 21 U.S.C. section 360bbb-3(b)(1), unless the authorization is terminated or revoked sooner.    Influenza A by PCR NEGATIVE NEGATIVE Final   Influenza B by PCR NEGATIVE NEGATIVE Final    Comment: (NOTE) The Xpert Xpress SARS-CoV-2/FLU/RSV assay is intended as an aid in  the diagnosis of influenza from Nasopharyngeal swab specimens and  should not be used as a sole  basis for treatment. Nasal washings and  aspirates are unacceptable for Xpert Xpress SARS-CoV-2/FLU/RSV  testing. Fact Sheet for Patients: https://www.moore.com/ Fact Sheet for Healthcare Providers: https://www.young.biz/ This test is not yet approved or cleared by the [place] FDA and  has been authorized for detection and/or diagnosis of SARS-CoV-2 by  FDA under an Emergency Use Authorization (EUA). This EUA will remain  in effect (meaning this test can be used) for the duration of the  Covid-19 declaration under Section 564(b)(1) of the Act, 21  U.S.C. section 360bbb-3(b)(1), unless the authorization is  terminated or revoked. Performed at [facility], [address] [place], [place] [address]   MRSA PCR Screening     Status: None   Collection Time: 04/03/19   3:42 PM   Specimen: Nasal Mucosa; Nasopharyngeal  Result Value Ref Range Status   MRSA by PCR NEGATIVE NEGATIVE Final    Comment:        The GeneXpert MRSA Assay (FDA approved for NASAL specimens only), is one component of a comprehensive MRSA colonization surveillance program. It is not intended to diagnose MRSA infection nor to guide or monitor treatment for MRSA infections. Performed at [facility], [address], [place], [place] [address]          Radiology Studies: CT Head Wo Contrast  Result Date: 04/03/2019 CLINICAL DATA:  Fall a few days prior. Right orbital bruise. Posterior neck pain. EXAM: CT HEAD WITHOUT CONTRAST CT CERVICAL SPINE WITHOUT CONTRAST TECHNIQUE: Multidetector CT imaging of the head and cervical spine was performed following the standard protocol without intravenous contrast. Multiplanar CT image reconstructions of the cervical spine were also generated. COMPARISON:  None. FINDINGS: CT HEAD FINDINGS Brain: No evidence of parenchymal hemorrhage or extra-axial fluid collection. No mass lesion, mass effect, or midline shift. No CT evidence of acute infarction. Nonspecific mild subcortical and periventricular white matter hypodensity, most in keeping with chronic small vessel ischemic change. Cerebral volume is age appropriate. No ventriculomegaly. Vascular: No acute abnormality. Skull: No evidence of calvarial fracture. Nondisplaced left zygomatic arch fracture appears well corticated and chronic, with no overlying soft tissue swelling. Sinuses/Orbits: The visualized paranasal sinuses are essentially clear. Other:  The mastoid air cells are unopacified. CT CERVICAL SPINE FINDINGS Alignment: Straightening of the cervical spine. No facet subluxation. Dens is well positioned between the lateral masses of C1. Mild 3 mm retrolisthesis at C5-6 and C6-7 and 3 mm anterolisthesis at C7-T1. Skull base and vertebrae: No acute fracture. No primary bone lesion  or focal pathologic process. Soft tissues and spinal canal: No prevertebral edema. No visible canal hematoma. Disc levels: Severe multilevel degenerative disc disease throughout the cervical spine, most prominent at C3-4, C5-6 and C6-7. Moderate to severe bilateral cervical facet arthropathy. Moderate degenerative foraminal stenosis bilaterally at C5-6. Upper chest: No acute abnormality. Other: Visualized mastoid air cells appear clear. No discrete thyroid nodules. No pathologically enlarged cervical nodes. IMPRESSION: 1. No evidence of acute intracranial abnormality. No evidence of calvarial fracture. 2. Mild chronic small vessel ischemic changes in the cerebral white matter. 3. Nondisplaced left zygomatic arch fracture appears well corticated and chronic, with no overlying soft tissue swelling. 4. No cervical spine fracture or facet subluxation. 5. 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 6. Mild multilevel cervical spondylolisthesis is probably degenerative. Electronically Signed   By: Delbert Phenix M.D.   On: 04/03/2019 11:56   CT Angio Chest PE W and/or Wo Contrast  Result Date: 04/03/2019 CLINICAL  DATA:  Severe left chest pain today. EXAM: CT ANGIOGRAPHY CHEST WITH CONTRAST TECHNIQUE: Multidetector CT imaging of the chest was performed using the standard protocol during bolus administration of intravenous contrast. Multiplanar CT image reconstructions and MIPs were obtained to evaluate the vascular anatomy. CONTRAST:  100 mL OMNIPAQUE IOHEXOL 350 MG/ML SOLN COMPARISON:  PA and lateral chest today. FINDINGS: Cardiovascular: The examination is positive for pulmonary embolus. Clot is best seen in the left main pulmonary artery and its branches to both the upper and lower lobes. The RV to LV ratio is 1.22 worrisome for right heart strain. Cardiomegaly and calcific aortic and coronary atherosclerosis are noted. No pericardial effusion. Thoracic aorta is normal in caliber.  Mediastinum/Nodes: No enlarged mediastinal, hilar, or axillary lymph nodes. Thyroid gland, trachea, and esophagus demonstrate no significant findings. Lungs/Pleura: Very small left pleural effusion is seen. Wedge-shaped peripheral opacity in the left lower lobe is worrisome for a pulmonary infarct. Dependent atelectasis is noted. Upper Abdomen: Single, small nonobstructing stones are seen in each kidney Musculoskeletal: No fracture or worrisome lesion. Multilevel spondylosis and mild scoliosis noted. Review of the MIP images confirms the above findings. IMPRESSION: The examination is positive for pulmonary embolus with findings worrisome for right heart strain. Peripheral wedge-shaped opacity in left lower lobe is worrisome for a pulmonary infarct. Cardiomegaly. Critical Value/emergent results were called by telephone at the time of interpretation on 04/03/2019 at 11:52 am to provider MELANIE BELFI , who verbally acknowledged these results. Aortic Atherosclerosis (ICD10-I70.0). Calcific coronary artery disease also noted. Electronically Signed   By: Drusilla Kanner M.D.   On: 04/03/2019 11:55   CT Cervical Spine Wo Contrast  Result Date: 04/03/2019 CLINICAL DATA:  Fall a few days prior. Right orbital bruise. Posterior neck pain. EXAM: CT HEAD WITHOUT CONTRAST CT CERVICAL SPINE WITHOUT CONTRAST TECHNIQUE: Multidetector CT imaging of the head and cervical spine was performed following the standard protocol without intravenous contrast. Multiplanar CT image reconstructions of the cervical spine were also generated. COMPARISON:  None. FINDINGS: CT HEAD FINDINGS Brain: No evidence of parenchymal hemorrhage or extra-axial fluid collection. No mass lesion, mass effect, or midline shift. No CT evidence of acute infarction. Nonspecific mild subcortical and periventricular white matter hypodensity, most in keeping with chronic small vessel ischemic change. Cerebral volume is age appropriate. No ventriculomegaly. Vascular:  No acute abnormality. Skull: No evidence of calvarial fracture. Nondisplaced left zygomatic arch fracture appears well corticated and chronic, with no overlying soft tissue swelling. Sinuses/Orbits: The visualized paranasal sinuses are essentially clear. Other:  The mastoid air cells are unopacified. CT CERVICAL SPINE FINDINGS Alignment: Straightening of the cervical spine. No facet subluxation. Dens is well positioned between the lateral masses of C1. Mild 3 mm retrolisthesis at C5-6 and C6-7 and 3 mm anterolisthesis at C7-T1. Skull base and vertebrae: No acute fracture. No primary bone lesion or focal pathologic process. Soft tissues and spinal canal: No prevertebral edema. No visible canal hematoma. Disc levels: Severe multilevel degenerative disc disease throughout the cervical spine, most prominent at C3-4, C5-6 and C6-7. Moderate to severe bilateral cervical facet arthropathy. Moderate degenerative foraminal stenosis bilaterally at C5-6. Upper chest: No acute abnormality. Other: Visualized mastoid air cells appear clear. No discrete thyroid nodules. No pathologically enlarged cervical nodes. IMPRESSION: 1. No evidence of acute intracranial abnormality. No evidence of calvarial fracture. 2. Mild chronic small vessel ischemic changes in the cerebral white matter. 3. Nondisplaced left zygomatic arch fracture appears well corticated and chronic, with no overlying soft tissue swelling. 4. No  cervical spine fracture or facet subluxation. 5. 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 6. Mild multilevel cervical spondylolisthesis is probably degenerative. Electronically Signed   By: Delbert PhenixJason A Poff M.D.   On: 04/03/2019 11:56   ECHOCARDIOGRAM COMPLETE  Result Date: 04/03/2019    ECHOCARDIOGRAM REPORT   Patient Name:   Willie Cisneros Date of Exam: 04/03/2019 Medical Rec #:  [id number]        Height:       69.0 in Accession #:    [id number]       Weight:       175.0 lb Date of Birth:   1926-10-09        BSA:          1.952 m Patient Age:    89 years         BP:           126/68 mmHg Patient Gender: M                HR:           120 bpm. Exam Location:  Inpatient Procedure: 2D Echo, Color Doppler and Cardiac Doppler STAT ECHO Indications:    I26.02 Pulmonary embolus  History:        Patient has no prior history of Echocardiogram examinations.  Sonographer:    Irving BurtonEmily Senior RDCS Referring Phys: [id number] Tomma LightningADEWALE A OLALERE  Sonographer Comments: Technically difficult study due to poor echo windows and irregular rhythm. IMPRESSIONS  1. Left ventricular ejection fraction, by estimation, is 40 to 45% with beat to beat variability. The left ventricle has mildly decreased function. Left ventricular endocardial border not optimally defined to evaluate regional wall motion. There is moderate left ventricular hypertrophy.  2. Right ventricular systolic function is mildly reduced. The right ventricular size is normal. Tricuspid regurgitation signal is inadequate for assessing PA pressure.  3. Left atrial size was mildly dilated.  4. The mitral valve is normal in structure. Trivial mitral valve regurgitation. No evidence of mitral stenosis.  5. The aortic valve is abnormal. Aortic valve regurgitation is not visualized. No aortic stenosis is present.  6. Aortic dilatation noted. There is borderline dilatation of the ascending aorta measuring 39 mm.  7. The inferior vena cava is normal in size with greater than 50% respiratory variability, suggesting right atrial pressure of 3 mmHg. FINDINGS  Left Ventricle: Left ventricular ejection fraction, by estimation, is 40 to 45%. The left ventricle has mildly decreased function. Left ventricular endocardial border not optimally defined to evaluate regional wall motion. The left ventricular internal cavity size was normal in size. There is moderate left ventricular hypertrophy. Left ventricular diastolic parameters are indeterminate. Right Ventricle: The right ventricular  size is normal. Right vetricular wall thickness was not assessed. Right ventricular systolic function is mildly reduced. Tricuspid regurgitation signal is inadequate for assessing PA pressure. Left Atrium: Left atrial size was mildly dilated. Right Atrium: Right atrial size was normal in size. Pericardium: There is no evidence of pericardial effusion. Mitral Valve: The mitral valve is normal in structure. Normal mobility of the mitral valve leaflets. Trivial mitral valve regurgitation. No evidence of mitral valve stenosis. Tricuspid Valve: The tricuspid valve is normal in structure. Tricuspid valve regurgitation is trivial. No evidence of tricuspid stenosis. Aortic Valve: The aortic valve is abnormal. Aortic valve regurgitation is not visualized. No aortic stenosis is present. There is mild calcification of the aortic valve. Pulmonic Valve: The pulmonic valve was normal in structure. Pulmonic  valve regurgitation is not visualized. No evidence of pulmonic stenosis. Aorta: Aortic dilatation noted. There is borderline dilatation of the ascending aorta measuring 39 mm. Venous: The inferior vena cava is normal in size with greater than 50% respiratory variability, suggesting right atrial pressure of 3 mmHg. IAS/Shunts: No atrial level shunt detected by color flow Doppler.  LEFT VENTRICLE PLAX 2D LVIDd:         4.60 cm LVIDs:         3.80 cm LV PW:         1.40 cm LV IVS:        1.50 cm LVOT diam:     2.00 cm LV SV:         38 LV SV Index:   20 LVOT Area:     3.14 cm  RIGHT VENTRICLE RV S prime:     11.00 cm/s TAPSE (M-mode): 1.6 cm LEFT ATRIUM             Index       RIGHT ATRIUM           Index LA diam:        4.20 cm 2.15 cm/m  RA Area:     16.90 cm LA Vol (A2C):   68.4 ml 35.04 ml/m RA Volume:   36.20 ml  18.54 ml/m LA Vol (A4C):   75.3 ml 38.58 ml/m LA Biplane Vol: 73.8 ml 37.81 ml/m  AORTIC VALVE LVOT Vmax:   75.53 cm/s LVOT Vmean:  55.467 cm/s LVOT VTI:    0.122 m                          PULMONARY ARTERY  AORTA                    MPA diam:        2.30 cm Ao Root diam: 2.70 cm Ao Asc diam:  3.70 cm  SHUNTS Systemic VTI:  0.12 m Systemic Diam: 2.00 cm Weston Brass MD Electronically signed by Weston Brass MD Signature Date/Time: 04/03/2019/3:13:54 PM    Final         Scheduled Meds:  Chlorhexidine Gluconate Cloth  6 each Topical Daily   latanoprost  1 drop Both Eyes QHS   mouth rinse  15 mL Mouth Rinse BID   metoprolol tartrate  25 mg Oral BID   Continuous Infusions:  heparin 1,400 Units/hr (04/05/19 0452)     LOS: 2 days    Time spent: 35 minutes     Talonda Artist A Arin Vanosdol, MD [facility]   If 7PM-7AM, please contact night-coverage [email or web address]  04/05/2019, 10:26 AM

## 2019-04-05 NOTE — Progress Notes (Signed)
ANTICOAGULATION CONSULT NOTE - Follow Up Consult  Pharmacy Consult for heparin Indication: acute pulmonary embolus  Allergies  Allergen Reactions  . Penicillins Anaphylaxis    Did it involve swelling of the face/tongue/throat, SOB, or low BP? Yes Did it involve sudden or severe rash/hives, skin peeling, or any reaction on the inside of your mouth or nose? Yes Did you need to seek medical attention at a hospital or doctor's office? Yes When did it last happen?Over 10 years ago If all above answers are "NO", may proceed with cephalosporin use.  . Sulfa Antibiotics Anaphylaxis and Rash  . Clindamycin Nausea Only  . Contrast Media  [Iodinated Diagnostic Agents] Nausea Only  . Econazole Nitrate Itching and Other (See Comments)    Burning  . Erythromycin Nausea Only  . Oxycodone Nausea And Vomiting    Patient Measurements: Height: 5\' 10"  (177.8 cm) Weight: 182 lb 8.7 oz (82.8 kg) IBW/kg (Calculated) : 73 Heparin Dosing Weight: 83 kg  Vital Signs: Temp: 99.7 F (37.6 C) (03/15 2117) Temp Source: Oral (03/15 2117) BP: 126/73 (03/15 2117) Pulse Rate: 107 (03/15 2117)  Labs: Recent Labs     0000 04/03/19 0917 04/03/19 1034 04/03/19 2238 04/04/19 0346 04/04/19 0840 04/04/19 1718 04/05/19 0451 04/05/19 2050  HGB   < > 14.3  --   --  12.8*  --   --  12.6*  --   HCT  --  44.0  --   --  40.8  --   --  38.8*  --   PLT  --  127*  --   --  141*  --   --  144*  --   HEPARINUNFRC  --   --   --    < >  --    < > 0.31 0.37 0.21*  CREATININE  --  1.00  --   --  0.96  --   --   --   --   TROPONINIHS  --  369* 450*  --   --   --   --   --   --    < > = values in this interval not displayed.    Estimated Creatinine Clearance: 52.8 mL/min (by C-G formula based on SCr of 0.96 mg/dL).   Assessment: Patient's a 86 y.o M presented to the ED on 3/13 with c/o CP and SOB. Chest CTA showed acute PE with concern for right heart strain. He's currently on heparin drip for PE.  Today,  04/05/2019: 0451heparin level remains therapeutic at 0.37, cbc stable, no bleeding documented  * Heparin off for MRI from 2p-3p, resumed at 1400 units/hr 2050 follow up Heparin level 0.21 units/ml, below therapeutic range  Goal of Therapy:  Heparin level 0.3-0.7 units/ml Monitor platelets by anticoagulation protocol: Yes   Plan:  Increase Heparin to 1550 units/hr Daily heparin level, daily CCBC Monitor for s/s bleeding  04/07/2019 PharmD 04/05/2019,9:18 PM

## 2019-04-06 ENCOUNTER — Inpatient Hospital Stay (HOSPITAL_COMMUNITY): Payer: Medicare Other

## 2019-04-06 LAB — CBC
HCT: 37.2 % — ABNORMAL LOW (ref 39.0–52.0)
Hemoglobin: 12.1 g/dL — ABNORMAL LOW (ref 13.0–17.0)
MCH: 30.7 pg (ref 26.0–34.0)
MCHC: 32.5 g/dL (ref 30.0–36.0)
MCV: 94.4 fL (ref 80.0–100.0)
Platelets: 136 10*3/uL — ABNORMAL LOW (ref 150–400)
RBC: 3.94 MIL/uL — ABNORMAL LOW (ref 4.22–5.81)
RDW: 13.2 % (ref 11.5–15.5)
WBC: 9.9 10*3/uL (ref 4.0–10.5)
nRBC: 0 % (ref 0.0–0.2)

## 2019-04-06 LAB — BASIC METABOLIC PANEL
Anion gap: 13 (ref 5–15)
BUN: 16 mg/dL (ref 8–23)
CO2: 23 mmol/L (ref 22–32)
Calcium: 8.4 mg/dL — ABNORMAL LOW (ref 8.9–10.3)
Chloride: 99 mmol/L (ref 98–111)
Creatinine, Ser: 0.93 mg/dL (ref 0.61–1.24)
GFR calc Af Amer: >60 mL/min (ref >60)
GFR calc non Af Amer: >60 mL/min (ref >60)
Glucose, Bld: 134 mg/dL — ABNORMAL HIGH (ref 70–99)
Potassium: 4.2 mmol/L (ref 3.5–5.1)
Sodium: 135 mmol/L (ref 135–145)

## 2019-04-06 LAB — HEPARIN LEVEL (UNFRACTIONATED)
Heparin Unfractionated: 0.29 IU/mL — ABNORMAL LOW (ref 0.30–0.70)
Heparin Unfractionated: 0.29 IU/mL — ABNORMAL LOW (ref 0.30–0.70)

## 2019-04-06 MED ORDER — HEPARIN (PORCINE) 25000 UT/250ML-% IV SOLN
1750.0000 [IU]/h | INTRAVENOUS | Status: AC
  Administered 2019-04-07: 1750 [IU]/h via INTRAVENOUS
  Filled 2019-04-06: qty 250

## 2019-04-06 MED ORDER — HEPARIN (PORCINE) 25000 UT/250ML-% IV SOLN
1650.0000 [IU]/h | INTRAVENOUS | Status: DC
  Filled 2019-04-06: qty 250

## 2019-04-06 MED ORDER — LIDOCAINE HCL 2 % IJ SOLN
INTRAMUSCULAR | Status: AC
  Filled 2019-04-06: qty 20

## 2019-04-06 MED ORDER — METOPROLOL TARTRATE 50 MG PO TABS
50.0000 mg | ORAL_TABLET | Freq: Two times a day (BID) | ORAL | Status: DC
  Administered 2019-04-06 – 2019-04-07 (×3): 50 mg via ORAL
  Filled 2019-04-06 (×3): qty 1

## 2019-04-06 NOTE — Progress Notes (Signed)
Progress Note  Patient Name: Willie Cisneros Date of Encounter: 04/06/2019  Primary Cardiologist: Dr Jens Som  Subjective   No CP or dyspnea  Inpatient Medications    Scheduled Meds: . Chlorhexidine Gluconate Cloth  6 each Topical Daily  . latanoprost  1 drop Both Eyes QHS  . mouth rinse  15 mL Mouth Rinse BID  . metoprolol tartrate  25 mg Oral BID   Continuous Infusions: . heparin 1,650 Units/hr (04/06/19 0703)   PRN Meds: metoprolol tartrate   Vital Signs    Vitals:   04/06/19 0147 04/06/19 0408 04/06/19 0537 04/06/19 0540  BP: 102/78  124/74   Pulse: (!) 112  (!) 117   Resp: 20  16   Temp: 99.5 F (37.5 C)  98.4 F (36.9 C)   TempSrc: Oral  Oral   SpO2: 92%  94%   Weight:  82.3 kg  81.6 kg  Height:        Intake/Output Summary (Last 24 hours) at 04/06/2019 0948 Last data filed at 04/06/2019 0538 Gross per 24 hour  Intake 840.42 ml  Output 575 ml  Net 265.42 ml   Last 3 Weights 04/06/2019 04/06/2019 04/05/2019  Weight (lbs) 179 lb 14.3 oz 181 lb 7 oz 182 lb 8.7 oz  Weight (kg) 81.6 kg 82.3 kg 82.8 kg      Telemetry    Atrial fibrillation rate mildly elevated - Personally Reviewed    Physical Exam   GEN: No acute distress.   Neck: No JVD Cardiac: irregular and tachycardic Respiratory: Clear to auscultation bilaterally. GI: Soft, nontender, non-distended  MS: No edema Neuro:  Nonfocal  Psych: Normal affect   Labs    High Sensitivity Troponin:   Recent Labs  Lab 04/03/19 0917 04/03/19 1034  TROPONINIHS 369* 450*      Chemistry Recent Labs  Lab 04/03/19 0917 04/04/19 0346 04/06/19 0619  NA 138 138 135  K 4.6 4.1 4.2  CL 104 102 99  CO2 24 25 23   GLUCOSE 146* 131* 134*  BUN 17 18 16   CREATININE 1.00 0.96 0.93  CALCIUM 9.0 8.6* 8.4*  GFRNONAA >60 >60 >60  GFRAA >60 >60 >60  ANIONGAP 10 11 13      Hematology Recent Labs  Lab 04/04/19 0346 04/05/19 0451 04/06/19 0619  WBC 10.0 9.9 9.9  RBC 4.22 4.08* 3.94*  HGB 12.8*  12.6* 12.1*  HCT 40.8 38.8* 37.2*  MCV 96.7 95.1 94.4  MCH 30.3 30.9 30.7  MCHC 31.4 32.5 32.5  RDW 13.6 13.4 13.2  PLT 141* 144* 136*    BNP Recent Labs  Lab 04/03/19 0926  BNP 125.9*     Radiology    MR BRAIN WO CONTRAST  Result Date: 04/05/2019 CLINICAL DATA:  Post fall, possible CVA EXAM: MRI HEAD WITHOUT CONTRAST TECHNIQUE: Multiplanar, multiecho pulse sequences of the brain and surrounding structures were obtained without intravenous contrast. COMPARISON:  None. FINDINGS: Brain: There is no acute infarction or intracranial hemorrhage. There is no intracranial mass, mass effect, or edema. There is no hydrocephalus or extra-axial fluid collection. Patchy foci of T2 hyperintensity in the supratentorial white matter are but may mild chronic microvascular ischemic changes. 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 Vascular: Major vessel flow voids at the skull base are preserved. Skull and upper cervical spine: Normal marrow signal is preserved. Sinuses/Orbits: Paranasal sinuses are aerated. Bilateral lens replacements. Other: Sella is unremarkable.  Mastoid air cells are clear. IMPRESSION: No evidence of recent infarction, hemorrhage,  or mass. Mild chronic microvascular ischemic changes. Electronically Signed   By: Macy Mis M.D.   On: 04/05/2019 14:55   MR CVELF RIGHT WO CONTRAST  Result Date: 04/05/2019 CLINICAL DATA:  Right thigh pain. Fall week ago. EXAM: MRI OF THE RIGHT FEMUR WITHOUT CONTRAST TECHNIQUE: Multiplanar, multisequence MR imaging of the right femur was performed. No intravenous contrast was administered. COMPARISON:  Right knee x-rays dated April 03, 2019. FINDINGS: Bones/Joint/Cartilage No marrow signal abnormality. No fracture or dislocation. Normal alignment. Small right knee joint effusion. Trace fluid in the right greater trochanteric bursa. Muscles and Tendons Intact. Mild fatty infiltration of the semimembranosus and biceps femoris muscles. No  muscle edema. Soft tissue No fluid collection or hematoma. No soft tissue mass. IMPRESSION: No acute injury of the right femur or thigh. Electronically Signed   By: Titus Dubin M.D.   On: 04/05/2019 15:20    Patient Profile     86 year old male with past medical history of nephrolithiasis for evaluation of recent pulmonary embolus, question cardiomyopathy and atrial fibrillation. Pt recently had boot placed secondary to stress fracture in his foot.  He has been somewhat inactive for 4 weeks.  He then developed pain in his chest described as a sharp pain that increased with inspiration.  Also with dyspnea.  He was admitted and found to have a pulmonary embolus.  Also had atrial fibrillation with rapid ventricular response.  Echocardiogram interpreted as ejection fraction 40 to 45%.  I reviewed and felt likely 50 to 55%. Chest CT shows pulmonary embolus. Troponin 450.   Assessment & Plan    1 recent pulmonary embolus-likely secondary to recent lower extremity immobilization. Patient is presently on heparin.  Will need to transition to apixaban at discharge.  2 atrial flutter/atrial fibrillation-patient remains in atrial fibrillation on telemetry.  Heart rate elevated.  Increase metoprolol to 50 mg twice daily and adjust regimen as needed. CHADSvasc 2. Atrial arrhythmias may be secondary to his recent pulmonary embolus though difficult to assess.  We will treat with apixaban at discharge as outlined above. Given that he is asymptomatic, will plan rate control for now; can consider DCCV in the future if needed.  3 ? Cardiomyopathy-I personally reviewed echo; images technically difficult; EF appears to be in the 50-55 range. Will not pursue further evaluation.   4 Elevated troponin-likely related to pulmonary embolus; no ischemic symptoms; no plans for further cardiac eval.   For questions or updates, please contact [place] Please consult [email or web address] for contact info under         Signed, Kirk Ruths, MD  04/06/2019, 9:48 AM

## 2019-04-06 NOTE — Progress Notes (Signed)
PROGRESS NOTE    Willie Cisneros  WFU:[id number] DOB: 09-06-1926 DOA: 04/03/2019 PCP: Cari Caraway, MD   Brief Narrative: 86 year old with past medical history significant for glaucoma who presented complaining of cough worsening shortness of breath and chest pain.  He recently had sustained a left foot strain for which he had a boot, he tripped on the boot and fell on March 7.  He hit his head and his face. He was found to have pulmonary embolus worrisome of for right heart strain.  And pulmonary infarct.  He was admitted to the critical care service and he was a started on heparin drip.  Vitals has remained stable and his  Oxygen saturation  has remained stable.   Assessment & Plan:   Active Problems:   Pulmonary embolism (HCC)  1-Pulmonary embolism ; with right side heart strain.  Continue with heparin Gtt today, plan to transition to eliquis tomorrow if vitals stable.  Plan to transition to [place].  Continue to monitor vitals.  Chest pain and dyspnea improved.   2-Right LE weakness;  MRI brain negative for stroke.  MRI right thigh negative for tendon rupture.  MRI knee showed knee effusion, meniscus tear, and popliteus tendon injury  Ortho consulted.    3-A fib RVR, new Cardiomathy, Ef 45 %; Continue with metoprolol.  Already on Heparin ,  Cardiology consult for new cardiomyopathy.  Appreciate cardiology assistance no further cardia evaluation planned.   4-Elevation troponin; in setting of PE>   5-Recent fall, facial trauma.    Estimated body mass index is 25.81 kg/m as calculated from the following:   Height as of this encounter: 5\' 10"  (1.778 m).   Weight as of this encounter: 81.6 kg.   DVT prophylaxis: Heparin  Code Status: Full code Family Communication: Care discussed with patient was able to understand plan of care Disposition Plan:  Patient is from: Home Anticipated d/c date: 2 days, need PT eval, continue evaluation for right knee pain and right leg  weakness. Barriers to d/c or necessity for inpatient status: Continue IV heparin for PE with right-sided heart strain high risk for decompensation. Knee arthrocentesis today   Consultants:  [facility] Cardiology  Procedures:   Echo: Ejection fraction 40 to 45% decreased biventricular function  Antimicrobials:    Subjective: He is breathing better, dyspnea improved.  Right knee pain persist. Weakness persist.    Objective: Vitals:   04/06/19 0537 04/06/19 0540 04/06/19 1034 04/06/19 1310  BP: 124/74  107/75 114/62  Pulse: (!) 117  (!) 104 86  Resp: 16  18 20   Temp: 98.4 F (36.9 C)  98.2 F (36.8 C) 98.7 F (37.1 C)  TempSrc: Oral  Oral Oral  SpO2: 94%  95% 97%  Weight:  81.6 kg    Height:        Intake/Output Summary (Last 24 hours) at 04/06/2019 1541 Last data filed at 04/06/2019 1300 Gross per 24 hour  Intake 441.63 ml  Output 1000 ml  Net -558.37 ml   Filed Weights   04/05/19 0452 04/06/19 0408 04/06/19 0540  Weight: 82.8 kg 82.3 kg 81.6 kg    Examination:  General exam: NAD Respiratory system: CTA Cardiovascular system: S 1, S IRR Gastrointestinal system: BS present, soft, nt Central nervous system: alert, oriented  Extremities: trace edema, right knee effusion    Data Reviewed: I have personally reviewed following labs and imaging studies  CBC: Recent Labs  Lab 04/03/19 0917 04/04/19 0346 04/05/19 0451 04/06/19 2025  WBC 12.0* 10.0 9.9 9.9  HGB 14.3 12.8* 12.6* 12.1*  HCT 44.0 40.8 38.8* 37.2*  MCV 95.9 96.7 95.1 94.4  PLT 127* 141* 144* 136*   Basic Metabolic Panel: Recent Labs  Lab 04/03/19 0917 04/04/19 0346 04/05/19 1112 04/06/19 0619  NA 138 138  --  135  K 4.6 4.1  --  4.2  CL 104 102  --  99  CO2 24 25  --  23  GLUCOSE 146* 131*  --  134*  BUN 17 18  --  16  CREATININE 1.00 0.96  --  0.93  CALCIUM 9.0 8.6*  --  8.4*  MG  --   --  2.2  --    GFR: Estimated Creatinine Clearance: 54.5 mL/min (by C-G formula based on SCr of  0.93 mg/dL). Liver Function Tests: No results for input(s): AST, ALT, ALKPHOS, BILITOT, PROT, ALBUMIN in the last 168 hours. No results for input(s): LIPASE, AMYLASE in the last 168 hours. No results for input(s): AMMONIA in the last 168 hours. Coagulation Profile: No results for input(s): INR, PROTIME in the last 168 hours. Cardiac Enzymes: No results for input(s): CKTOTAL, CKMB, CKMBINDEX, TROPONINI in the last 168 hours. BNP (last 3 results) No results for input(s): PROBNP in the last 8760 hours. HbA1C: No results for input(s): HGBA1C in the last 72 hours. CBG: No results for input(s): GLUCAP in the last 168 hours. Lipid Profile: No results for input(s): CHOL, HDL, LDLCALC, TRIG, CHOLHDL, LDLDIRECT in the last 72 hours. Thyroid Function Tests: Recent Labs    04/05/19 1112  TSH 1.474   Anemia Panel: No results for input(s): VITAMINB12, FOLATE, FERRITIN, TIBC, IRON, RETICCTPCT in the last 72 hours. Sepsis Labs: No results for input(s): PROCALCITON, LATICACIDVEN in the last 168 hours.  Recent Results (from the past 240 hour(s))  Respiratory Panel by RT PCR (Flu A&B, Covid) - Nasopharyngeal Swab     Status: None   Collection Time: 04/03/19 12:09 PM   Specimen: Nasopharyngeal Swab  Result Value Ref Range Status   SARS Coronavirus 2 by RT PCR NEGATIVE NEGATIVE Final    Comment: (NOTE) SARS-CoV-2 target nucleic acids are NOT DETECTED. The SARS-CoV-2 RNA is generally detectable in upper respiratoy specimens during the acute phase of infection. The lowest concentration of SARS-CoV-2 viral copies this assay can detect is 131 copies/mL. A negative result does not preclude SARS-Cov-2 infection and should not be used as the sole basis for treatment or other patient management decisions. A negative result may occur with  improper specimen collection/handling, submission of specimen other than nasopharyngeal swab, presence of viral mutation(s) within the areas targeted by this assay,  and inadequate number of viral copies (<131 copies/mL). A negative result must be combined with clinical observations, patient history, and epidemiological information. The expected result is Negative. Fact Sheet for Patients:  https://www.moore.com/ Fact Sheet for Healthcare Providers:  https://www.young.biz/ This test is not yet ap proved or cleared by the [place] FDA and  has been authorized for detection and/or diagnosis of SARS-CoV-2 by FDA under an Emergency Use Authorization (EUA). This EUA will remain  in effect (meaning this test can be used) for the duration of the COVID-19 declaration under Section 564(b)(1) of the Act, 21 U.S.C. section 360bbb-3(b)(1), unless the authorization is terminated or revoked sooner.    Influenza A by PCR NEGATIVE NEGATIVE Final   Influenza B by PCR NEGATIVE NEGATIVE Final    Comment: (NOTE) The Xpert Xpress SARS-CoV-2/FLU/RSV assay is intended as an aid  in  the diagnosis of influenza from Nasopharyngeal swab specimens and  should not be used as a sole basis for treatment. Nasal washings and  aspirates are unacceptable for Xpert Xpress SARS-CoV-2/FLU/RSV  testing. Fact Sheet for Patients: https://www.moore.com/ Fact Sheet for Healthcare Providers: https://www.young.biz/ This test is not yet approved or cleared by the [place] FDA and  has been authorized for detection and/or diagnosis of SARS-CoV-2 by  FDA under an Emergency Use Authorization (EUA). This EUA will remain  in effect (meaning this test can be used) for the duration of the  Covid-19 declaration under Section 564(b)(1) of the Act, 21  U.S.C. section 360bbb-3(b)(1), unless the authorization is  terminated or revoked. Performed at [facility], [address] [place], [place] [address]   MRSA PCR Screening     Status: None   Collection Time: 04/03/19  3:42 PM   Specimen:  Nasal Mucosa; Nasopharyngeal  Result Value Ref Range Status   MRSA by PCR NEGATIVE NEGATIVE Final    Comment:        The GeneXpert MRSA Assay (FDA approved for NASAL specimens only), is one component of a comprehensive MRSA colonization surveillance program. It is not intended to diagnose MRSA infection nor to guide or monitor treatment for MRSA infections. Performed at [facility], [address] [place], [place] [address]          Radiology Studies: MR BRAIN WO CONTRAST  Result Date: 04/05/2019 CLINICAL DATA:  Post fall, possible CVA EXAM: MRI HEAD WITHOUT CONTRAST TECHNIQUE: Multiplanar, multiecho pulse sequences of the brain and surrounding structures were obtained without intravenous contrast. COMPARISON:  None. FINDINGS: Brain: There is no acute infarction or intracranial hemorrhage. There is no intracranial mass, mass effect, or edema. There is no hydrocephalus or extra-axial fluid collection. Patchy foci of T2 hyperintensity in the supratentorial white matter are but may mild chronic microvascular ischemic changes. 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 Vascular: Major vessel flow voids at the skull base are preserved. Skull and upper cervical spine: Normal marrow signal is preserved. Sinuses/Orbits: Paranasal sinuses are aerated. Bilateral lens replacements. Other: Sella is unremarkable.  Mastoid air cells are clear. IMPRESSION: No evidence of recent infarction, hemorrhage, or mass. Mild chronic microvascular ischemic changes. Electronically Signed   By: Guadlupe Spanish M.D.   On: 04/05/2019 14:55   MR CHYIF RIGHT WO CONTRAST  Result Date: 04/05/2019 CLINICAL DATA:  Right thigh pain. Fall week ago. EXAM: MRI OF THE RIGHT FEMUR WITHOUT CONTRAST TECHNIQUE: Multiplanar, multisequence MR imaging of the right femur was performed. No intravenous contrast was administered. COMPARISON:  Right knee x-rays dated April 03, 2019. FINDINGS:  Bones/Joint/Cartilage No marrow signal abnormality. No fracture or dislocation. Normal alignment. Small right knee joint effusion. Trace fluid in the right greater trochanteric bursa. Muscles and Tendons Intact. Mild fatty infiltration of the semimembranosus and biceps femoris muscles. No muscle edema. Soft tissue No fluid collection or hematoma. No soft tissue mass. IMPRESSION: No acute injury of the right femur or thigh. Electronically Signed   By: Obie Dredge M.D.   On: 04/05/2019 15:20   MR KNEE RIGHT WO CONTRAST  Result Date: 04/06/2019 CLINICAL DATA:  Right knee pain after fall 1 week ago EXAM: MRI OF THE RIGHT KNEE WITHOUT CONTRAST TECHNIQUE: Multiplanar, multisequence MR imaging of the knee was performed. No intravenous contrast was administered. COMPARISON:  X-ray 04/03/2019. Right femur MRI 04/05/2019 FINDINGS: MENISCI Medial meniscus: Focal radial tear at the posterior horn-body junction (  series 5, image 22; series 10, image 6). Possible nondisplaced horizontal tear of the posterior horn (series 10, image 7). Mild intrasubstance degeneration of the posterior horn and body. Lateral meniscus:  Intact. LIGAMENTS Cruciates:  Intact ACL and PCL. Collaterals: Medial collateral ligament is intact. Lateral collateral ligament complex is intact. CARTILAGE Patellofemoral: Trochlear chondral thinning without focal defect. Patellar cartilage intact. Medial: Mild partial-thickness cartilage loss of the medial femorotibial compartment. Lateral:  No chondral defect. Joint:  Moderate joint effusion. Unremarkable fat pads. Popliteal Fossa: Distal popliteus tendon is thickened and heterogeneous (series 8, image 10) with intramuscular edema within the popliteus muscle belly. No Baker's cyst. Extensor Mechanism: Intact quadriceps tendon and patellar tendon. Mild distal patellar tendinosis. Bones: No focal marrow signal abnormality. No fracture or dislocation. Other: Mild prepatellar soft tissue edema. Small ganglion  posteromedial to the fibular head. IMPRESSION: 1. Focal radial tear at the posterior horn-body junction of the medial meniscus. Probable nondisplaced horizontal tear component involving the posterior horn. 2. Popliteus tendon injury and muscle strain. 3. Moderate joint effusion. Electronically Signed   By: Duanne Guess D.O.   On: 04/06/2019 10:51        Scheduled Meds: . Chlorhexidine Gluconate Cloth  6 each Topical Daily  . latanoprost  1 drop Both Eyes QHS  . lidocaine      . mouth rinse  15 mL Mouth Rinse BID  . metoprolol tartrate  50 mg Oral BID   Continuous Infusions: . heparin 1,650 Units/hr (04/06/19 1032)     LOS: 3 days    Time spent: 35 minutes     Kotaro Buer A Mizuki Hoel, MD [facility]   If 7PM-7AM, please contact night-coverage [email or web address]  04/06/2019, 3:41 PM

## 2019-04-06 NOTE — Consult Note (Signed)
Reason for Consult: Right knee pain Referring Physician:  Sunnie Nielsen, MD  Willie Cisneros is an 86 y.o. male.  HPI: 86 yo male with recent fall on to right knee side.   Admitted for acute PE and now receiving treatment for it. Asked to see him for his right knee pain and MRI findings.  Reviewed history with him as noted in admitting H&P Elderly gentleman who presented with shortness of breath and chest discomfort. He recently had sustained a left foot strain for which he had a boot, he tripped on the boot and fell on March 7.  He did hit his head right side of his face, stated his head bounced off the concrete about twice.  There was no loss of consciousness. Shortness of breath with minimal exertion, left-sided chest pains He has had some pain in his right knee as well following the fall Unable to take deep breaths because of pain  Plus: (from ER) Patient is a 86 year old male who presents with shortness of breath.  He states that he had a fall on March 7.  He has been wearing a postop shoe for stress fracture of his toe.  He said his right foot stepped on the postop shoe that was on the left foot and he tripped, falling over onto his right side.  He hit his right side of his head.  There is no loss of consciousness.  He has had some swelling and pain to his right temporal area.  He said he saw his ophthalmologist who said his eye looked okay but he should get further imaging of the bony structures around his eye.  He never went for evaluation of that.  He said that over the last few days he has had some worsening shortness of breath.  He reports shortness of breath with minimal exertion.  He has a sharp pain in his left chest that is worse with breathing.  He is only able to take short rapid breaths because of the pain.  He denies any known injury to that chest wall on falling.  He says that he was starting to have some shortness of breath a week or so before the fall but it seems to have gotten worse  since the fall.  He denies any change in his leg swelling.  No fevers.  No cough or cold symptoms.  He does say that his knee is also been hurting since the fall.  It was not hurting initially but over the last couple days is gotten worse.  He feels like he is having some cramping in his upper thigh that makes his leg feel weak.  He denies any weakness in his upper extremities.  No other injuries noted from the fall.  Past Medical History:  Diagnosis Date  . Glaucoma   . Kidney stones   . Medical history non-contributory   . Shingles   . TIA (transient ischemic attack)     Past Surgical History:  Procedure Laterality Date  . NO PAST SURGERIES      Family History  Problem Relation Age of Onset  . Heart disease Neg Hx     Social History:  reports that he has never smoked. He has never used smokeless tobacco. He reports previous alcohol use. He reports that he does not use drugs.  Allergies:  Allergies  Allergen Reactions  . Penicillins Anaphylaxis    Did it involve swelling of the face/tongue/throat, SOB, or low BP? Yes Did it involve sudden or  severe rash/hives, skin peeling, or any reaction on the inside of your mouth or nose? Yes Did you need to seek medical attention at a hospital or doctor's office? Yes When did it last happen?Over 10 years ago If all above answers are "NO", may proceed with cephalosporin use.  . Sulfa Antibiotics Anaphylaxis and Rash  . Clindamycin Nausea Only  . Contrast Media  [Iodinated Diagnostic Agents] Nausea Only  . Econazole Nitrate Itching and Other (See Comments)    Burning  . Erythromycin Nausea Only  . Oxycodone Nausea And Vomiting    Medications:  I have reviewed the patient's current medications. Scheduled: . Chlorhexidine Gluconate Cloth  6 each Topical Daily  . latanoprost  1 drop Both Eyes QHS  . lidocaine      . mouth rinse  15 mL Mouth Rinse BID  . metoprolol tartrate  50 mg Oral BID    Results for orders placed or  performed during the hospital encounter of 04/03/19 (from the past 24 hour(s))  Heparin level (unfractionated)     Status: Abnormal   Collection Time: 04/05/19  8:50 PM  Result Value Ref Range   Heparin Unfractionated 0.21 (L) 0.30 - 0.70 IU/mL  CBC     Status: Abnormal   Collection Time: 04/06/19  6:19 AM  Result Value Ref Range   WBC 9.9 4.0 - 10.5 K/uL   RBC 3.94 (L) 4.22 - 5.81 MIL/uL   Hemoglobin 12.1 (L) 13.0 - 17.0 g/dL   HCT 37.2 (L) 39.0 - 52.0 %   MCV 94.4 80.0 - 100.0 fL   MCH 30.7 26.0 - 34.0 pg   MCHC 32.5 30.0 - 36.0 g/dL   RDW 13.2 11.5 - 15.5 %   Platelets 136 (L) 150 - 400 K/uL   nRBC 0.0 0.0 - 0.2 %  Heparin level (unfractionated)     Status: Abnormal   Collection Time: 04/06/19  6:19 AM  Result Value Ref Range   Heparin Unfractionated 0.29 (L) 0.30 - 0.70 IU/mL  Basic metabolic panel     Status: Abnormal   Collection Time: 04/06/19  6:19 AM  Result Value Ref Range   Sodium 135 135 - 145 mmol/L   Potassium 4.2 3.5 - 5.1 mmol/L   Chloride 99 98 - 111 mmol/L   CO2 23 22 - 32 mmol/L   Glucose, Bld 134 (H) 70 - 99 mg/dL   BUN 16 8 - 23 mg/dL   Creatinine, Ser 0.93 0.61 - 1.24 mg/dL   Calcium 8.4 (L) 8.9 - 10.3 mg/dL   GFR calc non Af Amer >60 >60 mL/min   GFR calc Af Amer >60 >60 mL/min   Anion gap 13 5 - 15    X-ray: CLINICAL DATA:  Acute RIGHT knee pain following fall several days ago. Initial encounter.  EXAM: RIGHT KNEE - COMPLETE 4+ VIEW  COMPARISON:  None.  FINDINGS: No acute fracture, subluxation or dislocation identified.  Minimal tricompartmental joint space narrowing/osteophytosis noted.  There is no evidence of joint effusion.  No focal bony lesions are present.  Heavy vascular calcifications are identified.  IMPRESSION: 1. No evidence of acute abnormality. 2. Minimal tricompartmental degenerative changes.   Electronically Signed   By: Margarette Canada M.D.  CLINICAL DATA:  Right knee pain after fall 1 week  ago  EXAM: MRI OF THE RIGHT KNEE WITHOUT CONTRAST  TECHNIQUE: Multiplanar, multisequence MR imaging of the knee was performed. No intravenous contrast was administered.  COMPARISON:  X-ray 04/03/2019. Right femur MRI 04/05/2019  FINDINGS: MENISCI  Medial meniscus: Focal radial tear at the posterior horn-body junction (series 5, image 22; series 10, image 6). Possible nondisplaced horizontal tear of the posterior horn (series 10, image 7). Mild intrasubstance degeneration of the posterior horn and body.  Lateral meniscus:  Intact.  LIGAMENTS  Cruciates:  Intact ACL and PCL.  Collaterals: Medial collateral ligament is intact. Lateral collateral ligament complex is intact.  CARTILAGE  Patellofemoral: Trochlear chondral thinning without focal defect. Patellar cartilage intact.  Medial: Mild partial-thickness cartilage loss of the medial femorotibial compartment.  Lateral:  No chondral defect.  Joint:  Moderate joint effusion. Unremarkable fat pads.  Popliteal Fossa: Distal popliteus tendon is thickened and heterogeneous (series 8, image 10) with intramuscular edema within the popliteus muscle belly. No Baker's cyst.  Extensor Mechanism: Intact quadriceps tendon and patellar tendon. Mild distal patellar tendinosis.  Bones: No focal marrow signal abnormality. No fracture or dislocation.  Other: Mild prepatellar soft tissue edema. Small ganglion posteromedial to the fibular head.  IMPRESSION: 1. Focal radial tear at the posterior horn-body junction of the medial meniscus. Probable nondisplaced horizontal tear component involving the posterior horn. 2. Popliteus tendon injury and muscle strain. 3. Moderate joint effusion.   Electronically Signed   By: Duanne Guess D.O.  ROS: Review of Systems  Constitutional: Positive for fatigue. Negative for chills, diaphoresis and fever.  HENT: Negative for congestion, rhinorrhea and sneezing.    Eyes: Negative.   Respiratory: Positive for shortness of breath. Negative for cough and chest tightness.   Cardiovascular: Positive for chest pain. Negative for leg swelling.  Gastrointestinal: Negative for abdominal pain, blood in stool, diarrhea, nausea and vomiting.  Genitourinary: Negative for difficulty urinating, flank pain, frequency and hematuria.  Musculoskeletal: Positive for arthralgias. Negative for back pain.  Skin: Negative for rash.  Neurological: Negative for dizziness, speech difficulty, weakness, numbness and headaches.    Blood pressure 114/62, pulse 86, temperature 98.7 F (37.1 C), temperature source Oral, resp. rate 20, height 5\' 10"  (1.778 m), weight 81.6 kg, SpO2 97 %.  Physical Exam : Constitutional:      Appearance: He is well-developed.  HENT:     Head: Normocephalic.     Comments: Patient has some old appearing ecchymosis to the right forehead and right temporal area.  There is some right periorbital ecchymosis. Eyes:     Extraocular Movements: Extraocular movements intact.     Conjunctiva/sclera: Conjunctivae normal.     Pupils: Pupils are equal, round, and reactive to light.  Cardiovascular:     Rate and Rhythm: Regular rhythm. Tachycardia present.     Heart sounds: Normal heart sounds.     Comments: No tenderness to the rib cage, no visible external trauma noted to the chest or abdomen Pulmonary:     Effort: Pulmonary effort is normal. Tachypnea present. No respiratory distress.     Breath sounds: Normal breath sounds. No wheezing or rales.  Chest:     Chest wall: No tenderness.  Abdominal:     General: Bowel sounds are normal.     Palpations: Abdomen is soft.     Tenderness: There is no abdominal tenderness. There is no guarding or rebound.  Musculoskeletal:        General: Normal range of motion.     Cervical back: Normal range of motion and neck supple.     Comments: 1+ edema to the lower extremities bilaterally, slightly worse on the left,  there is an abrasion over the right knee.  There are  some mild tenderness on palpation of the knee.  MODERATE to LARGE effusion.  He is able do a straight leg raise.  Pedal pulses are intact bilaterally.  There is no other pain on palpation or range of motion the extremities  Lymphadenopathy:     Cervical: No cervical adenopathy.  Skin:    General: Skin is warm and dry.     Findings: No rash.  Neurological:     Mental Status: He is alert and oriented to person, place, and time.     Comments: 5 out of 5 strength in all extremities, sensation grossly intact to light touch in all extremities, cranial nerves II through XII grossly intact     Assessment/Plan: 1. Right knee pain and swelling 2. Right medial meniscal tear by MRI  Plan: After reviewing his history with him, his exam and radiographic findings I offered to aspirate his knee to decompress. Ace wrap for comfort ICE to right knee PT - WBAT, no restriction with ROM  Follow up at [facility] as outpatient as needed for further work up and management of this knee  Important to receive treatment and stabilization of his PE prior to any surgical considerations  Procedure: Right knee aspiration: Under chlrohexidine prep and after local lidocaine infection over superior lateral aspect of joint I aspirated 60cc of slightly inflamed appearing joint fluid, no signs of infection. The site was then dressed with a bandaid and the knee wrapped in a ACE wrap  Shelda Pal 04/06/2019, 2:05 PM

## 2019-04-06 NOTE — Progress Notes (Signed)
ANTICOAGULATION CONSULT NOTE - Follow Up Consult  Pharmacy Consult for heparin Indication: acute pulmonary embolus  Allergies  Allergen Reactions  . Penicillins Anaphylaxis    Did it involve swelling of the face/tongue/throat, SOB, or low BP? Yes Did it involve sudden or severe rash/hives, skin peeling, or any reaction on the inside of your mouth or nose? Yes Did you need to seek medical attention at a hospital or doctor's office? Yes When did it last happen?Over 10 years ago If all above answers are "NO", may proceed with cephalosporin use.  . Sulfa Antibiotics Anaphylaxis and Rash  . Clindamycin Nausea Only  . Contrast Media  [Iodinated Diagnostic Agents] Nausea Only  . Econazole Nitrate Itching and Other (See Comments)    Burning  . Erythromycin Nausea Only  . Oxycodone Nausea And Vomiting    Patient Measurements: Height: 5\' 10"  (177.8 cm) Weight: 179 lb 14.3 oz (81.6 kg) IBW/kg (Calculated) : 73 Heparin Dosing Weight: actual body weight   Vital Signs: Temp: 98.4 F (36.9 C) (03/16 0537) Temp Source: Oral (03/16 0537) BP: 124/74 (03/16 0537) Pulse Rate: 117 (03/16 0537)  Labs: Recent Labs     0000 04/03/19 0917 04/03/19 1034 04/03/19 2238 04/04/19 0346 04/04/19 0840 04/05/19 0451 04/05/19 2050 04/06/19 0619  HGB   < > 14.3  --   --  12.8*  --  12.6*  --   --   HCT  --  44.0  --   --  40.8  --  38.8*  --   --   PLT  --  127*  --   --  141*  --  144*  --   --   HEPARINUNFRC  --   --   --    < >  --    < > 0.37 0.21* 0.29*  CREATININE  --  1.00  --   --  0.96  --   --   --   --   TROPONINIHS  --  369* 450*  --   --   --   --   --   --    < > = values in this interval not displayed.    Estimated Creatinine Clearance: 52.8 mL/min (by C-G formula based on SCr of 0.96 mg/dL).   Assessment: 86 y/o M who presented to the ED on 3/13 with c/o CP and SOB. Chest CTa showed acute PE with concern for right heart strain. Pharmacy consulted for IV heparin dosing for  PE.  Today, 04/06/2019:  AM heparin level = 0.29 units/mL, slightly subtherapeutic  CBC: Hgb 12.1 stable, Pltc decreased to 136K  No bleeding or infusion issues noted per nursing  Goal of Therapy:  Heparin level 0.3-0.7 units/ml Monitor platelets by anticoagulation protocol: Yes   Plan:  Increase heparin infusion to 1650 units/hr Heparin level 8 hours after rate change Daily CBC, heparin level Monitor closely for s/sx of bleeding   04/08/2019, PharmD, BCPS Clinical Pharmacist 04/06/2019,6:48 AM

## 2019-04-06 NOTE — Care Management Important Message (Signed)
Important Message  Patient Details IM Letter given to Lanier Clam RN Case Manager to present to the Patient Name: Willie Cisneros MRN: [id number] Date of Birth: 20-Sep-1926   Medicare Important Message Given:  Yes     Caren Macadam 04/06/2019, 11:27 AM

## 2019-04-06 NOTE — Progress Notes (Signed)
ANTICOAGULATION CONSULT NOTE - Follow Up Consult  Pharmacy Consult for heparin Indication: acute pulmonary embolus  Allergies  Allergen Reactions  . Penicillins Anaphylaxis    Did it involve swelling of the face/tongue/throat, SOB, or low BP? Yes Did it involve sudden or severe rash/hives, skin peeling, or any reaction on the inside of your mouth or nose? Yes Did you need to seek medical attention at a hospital or doctor's office? Yes When did it last happen?Over 10 years ago If all above answers are "NO", may proceed with cephalosporin use.  . Sulfa Antibiotics Anaphylaxis and Rash  . Clindamycin Nausea Only  . Contrast Media  [Iodinated Diagnostic Agents] Nausea Only  . Econazole Nitrate Itching and Other (See Comments)    Burning  . Erythromycin Nausea Only  . Oxycodone Nausea And Vomiting    Patient Measurements: Height: 5\' 10"  (177.8 cm) Weight: 179 lb 14.3 oz (81.6 kg) IBW/kg (Calculated) : 73 Heparin Dosing Weight: actual body weight   Vital Signs: Temp: 98.6 F (37 C) (03/16 1717) Temp Source: Oral (03/16 1717) BP: 122/64 (03/16 1717) Pulse Rate: 94 (03/16 1717)  Labs: Recent Labs    04/04/19 0346 04/04/19 0840 04/05/19 0451 04/05/19 0451 04/05/19 2050 04/06/19 0619 04/06/19 1746  HGB 12.8*  --  12.6*  --   --  12.1*  --   HCT 40.8  --  38.8*  --   --  37.2*  --   PLT 141*  --  144*  --   --  136*  --   HEPARINUNFRC  --    < > 0.37   < > 0.21* 0.29* 0.29*  CREATININE 0.96  --   --   --   --  0.93  --    < > = values in this interval not displayed.    Estimated Creatinine Clearance: 54.5 mL/min (by C-G formula based on SCr of 0.93 mg/dL).   Assessment: 86 y/o M who presented to the ED on 3/13 with c/o CP and SOB. Chest CTa showed acute PE with concern for right heart strain. Pharmacy consulted for IV heparin dosing for PE.  Today, 04/06/2019:  PM heparin level = 0.29 units/mL, slightly subtherapeutic  CBC: Hgb 12.1 stable, Pltc decreased to  136K  No bleeding or infusion issues noted   Goal of Therapy:  Heparin level 0.3-0.7 units/ml Monitor platelets by anticoagulation protocol: Yes   Plan:  Increase heparin infusion to 1750 units/hr Heparin level 8 hours after rate change Daily CBC, heparin level Monitor closely for s/sx of bleeding Note plans to transition to Eliquis tomorrow if vitals stable   04/08/2019, PharmD 04/06/2019,7:11 PM

## 2019-04-07 DIAGNOSIS — I4891 Unspecified atrial fibrillation: Secondary | ICD-10-CM

## 2019-04-07 LAB — CBC
HCT: 37.3 % — ABNORMAL LOW (ref 39.0–52.0)
Hemoglobin: 12.2 g/dL — ABNORMAL LOW (ref 13.0–17.0)
MCH: 30.3 pg (ref 26.0–34.0)
MCHC: 32.7 g/dL (ref 30.0–36.0)
MCV: 92.8 fL (ref 80.0–100.0)
Platelets: 132 10*3/uL — ABNORMAL LOW (ref 150–400)
RBC: 4.02 MIL/uL — ABNORMAL LOW (ref 4.22–5.81)
RDW: 13.1 % (ref 11.5–15.5)
WBC: 8.3 10*3/uL (ref 4.0–10.5)
nRBC: 0 % (ref 0.0–0.2)

## 2019-04-07 LAB — HEPARIN LEVEL (UNFRACTIONATED): Heparin Unfractionated: 0.45 IU/mL (ref 0.30–0.70)

## 2019-04-07 MED ORDER — APIXABAN 5 MG PO TABS: 5.0000 mg | ORAL_TABLET | Freq: Two times a day (BID) | ORAL | Status: DC

## 2019-04-07 MED ORDER — METOPROLOL TARTRATE 50 MG PO TABS
75.0000 mg | ORAL_TABLET | Freq: Two times a day (BID) | ORAL | Status: DC
  Administered 2019-04-07: 75 mg via ORAL
  Filled 2019-04-07: qty 1

## 2019-04-07 MED ORDER — APIXABAN 5 MG PO TABS
10.0000 mg | ORAL_TABLET | Freq: Two times a day (BID) | ORAL | Status: DC
  Administered 2019-04-07 – 2019-04-09 (×5): 10 mg via ORAL
  Filled 2019-04-07 (×5): qty 2

## 2019-04-07 NOTE — Progress Notes (Signed)
Progress Note  Patient Name: Willie Cisneros Date of Encounter: 04/07/2019  Primary Cardiologist: Dr Jens Som  Subjective   No CP or dyspnea; right knee pain improving  Inpatient Medications    Scheduled Meds: . Chlorhexidine Gluconate Cloth  6 each Topical Daily  . latanoprost  1 drop Both Eyes QHS  . mouth rinse  15 mL Mouth Rinse BID  . metoprolol tartrate  50 mg Oral BID   Continuous Infusions: . heparin 1,750 Units/hr (04/07/19 0916)   PRN Meds: metoprolol tartrate   Vital Signs    Vitals:   04/06/19 2140 04/07/19 0140 04/07/19 0446 04/07/19 0500  BP: 109/77 112/86 111/84   Pulse: 88 92 96   Resp: (!) 22 (!) 22 (!) 22   Temp: 98.8 F (37.1 C) 99.3 F (37.4 C) 98.6 F (37 C)   TempSrc: Oral Oral Oral   SpO2: 93% 92% 95%   Weight:    82.2 kg  Height:        Intake/Output Summary (Last 24 hours) at 04/07/2019 0933 Last data filed at 04/07/2019 0300 Gross per 24 hour  Intake 138.4 ml  Output 1300 ml  Net -1161.6 ml   Last 3 Weights 04/07/2019 04/06/2019 04/06/2019  Weight (lbs) 181 lb 3.5 oz 179 lb 14.3 oz 181 lb 7 oz  Weight (kg) 82.2 kg 81.6 kg 82.3 kg      Telemetry    Atrial fibrillation rate mildly elevated - Personally Reviewed    Physical Exam   GEN: No acute distress.  WD WN Neck: No JVD, supple Cardiac: irregular and tachycardic, no murmur Respiratory: CTA GI: Soft, NT/ND MS: Right knee wrapped; 1+ edema Neuro:  Grossly intact Psych: Normal affect   Labs    High Sensitivity Troponin:   Recent Labs  Lab 04/03/19 0917 04/03/19 1034  TROPONINIHS 369* 450*      Chemistry Recent Labs  Lab 04/03/19 0917 04/04/19 0346 04/06/19 0619  NA 138 138 135  K 4.6 4.1 4.2  CL 104 102 99  CO2 24 25 23   GLUCOSE 146* 131* 134*  BUN 17 18 16   CREATININE 1.00 0.96 0.93  CALCIUM 9.0 8.6* 8.4*  GFRNONAA >60 >60 >60  GFRAA >60 >60 >60  ANIONGAP 10 11 13      Hematology Recent Labs  Lab 04/05/19 0451 04/06/19 0619 04/07/19 0452    WBC 9.9 9.9 8.3  RBC 4.08* 3.94* 4.02*  HGB 12.6* 12.1* 12.2*  HCT 38.8* 37.2* 37.3*  MCV 95.1 94.4 92.8  MCH 30.9 30.7 30.3  MCHC 32.5 32.5 32.7  RDW 13.4 13.2 13.1  PLT 144* 136* 132*    BNP Recent Labs  Lab 04/03/19 0926  BNP 125.9*     Radiology    MR BRAIN WO CONTRAST  Result Date: 04/05/2019 CLINICAL DATA:  Post fall, possible CVA EXAM: MRI HEAD WITHOUT CONTRAST TECHNIQUE: Multiplanar, multiecho pulse sequences of the brain and surrounding structures were obtained without intravenous contrast. COMPARISON:  None. FINDINGS: Brain: There is no acute infarction or intracranial hemorrhage. There is no intracranial mass, mass effect, or edema. There is no hydrocephalus or extra-axial fluid collection. Patchy foci of T2 hyperintensity in the supratentorial white matter are but may mild chronic microvascular ischemic changes. 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 Vascular: Major vessel flow voids at the skull base are preserved. Skull and upper cervical spine: Normal marrow signal is preserved. Sinuses/Orbits: Paranasal sinuses are aerated. Bilateral lens replacements. Other: Sella is unremarkable.  Mastoid air  cells are clear. IMPRESSION: No evidence of recent infarction, hemorrhage, or mass. Mild chronic microvascular ischemic changes. Electronically Signed   By: Guadlupe Spanish M.D.   On: 04/05/2019 14:55   MR NGEXB RIGHT WO CONTRAST  Result Date: 04/05/2019 CLINICAL DATA:  Right thigh pain. Fall week ago. EXAM: MRI OF THE RIGHT FEMUR WITHOUT CONTRAST TECHNIQUE: Multiplanar, multisequence MR imaging of the right femur was performed. No intravenous contrast was administered. COMPARISON:  Right knee x-rays dated April 03, 2019. FINDINGS: Bones/Joint/Cartilage No marrow signal abnormality. No fracture or dislocation. Normal alignment. Small right knee joint effusion. Trace fluid in the right greater trochanteric bursa. Muscles and Tendons Intact. Mild fatty infiltration  of the semimembranosus and biceps femoris muscles. No muscle edema. Soft tissue No fluid collection or hematoma. No soft tissue mass. IMPRESSION: No acute injury of the right femur or thigh. Electronically Signed   By: Obie Dredge M.D.   On: 04/05/2019 15:20   MR KNEE RIGHT WO CONTRAST  Result Date: 04/06/2019 CLINICAL DATA:  Right knee pain after fall 1 week ago EXAM: MRI OF THE RIGHT KNEE WITHOUT CONTRAST TECHNIQUE: Multiplanar, multisequence MR imaging of the knee was performed. No intravenous contrast was administered. COMPARISON:  X-ray 04/03/2019. Right femur MRI 04/05/2019 FINDINGS: MENISCI Medial meniscus: Focal radial tear at the posterior horn-body junction (series 5, image 22; series 10, image 6). Possible nondisplaced horizontal tear of the posterior horn (series 10, image 7). Mild intrasubstance degeneration of the posterior horn and body. Lateral meniscus:  Intact. LIGAMENTS Cruciates:  Intact ACL and PCL. Collaterals: Medial collateral ligament is intact. Lateral collateral ligament complex is intact. CARTILAGE Patellofemoral: Trochlear chondral thinning without focal defect. Patellar cartilage intact. Medial: Mild partial-thickness cartilage loss of the medial femorotibial compartment. Lateral:  No chondral defect. Joint:  Moderate joint effusion. Unremarkable fat pads. Popliteal Fossa: Distal popliteus tendon is thickened and heterogeneous (series 8, image 10) with intramuscular edema within the popliteus muscle belly. No Baker's cyst. Extensor Mechanism: Intact quadriceps tendon and patellar tendon. Mild distal patellar tendinosis. Bones: No focal marrow signal abnormality. No fracture or dislocation. Other: Mild prepatellar soft tissue edema. Small ganglion posteromedial to the fibular head. IMPRESSION: 1. Focal radial tear at the posterior horn-body junction of the medial meniscus. Probable nondisplaced horizontal tear component involving the posterior horn. 2. Popliteus tendon injury and  muscle strain. 3. Moderate joint effusion. Electronically Signed   By: Duanne Guess D.O.   On: 04/06/2019 10:51    Patient Profile     86 year old male with past medical history of nephrolithiasis for evaluation of recent pulmonary embolus, question cardiomyopathy and atrial fibrillation. Pt recently had boot placed secondary to stress fracture in his foot.  He has been somewhat inactive for 4 weeks.  He then developed pain in his chest described as a sharp pain that increased with inspiration.  Also with dyspnea.  He was admitted and found to have a pulmonary embolus.  Also had atrial fibrillation with rapid ventricular response.  Echocardiogram interpreted as ejection fraction 40 to 45%.  I reviewed and felt likely 50 to 55%. Chest CT shows pulmonary embolus. Troponin 450.   Assessment & Plan    1 recent pulmonary embolus-likely secondary to recent lower extremity immobilization. Patient is presently on heparin.  Change to apixaban at PE dosing when ok with primary service.  2 atrial flutter/atrial fibrillation-patient remains in atrial fibrillation on telemetry.  Heart rate elevated.  Increase metoprolol to 75 mg twice daily and adjust regimen as needed. CHADSvasc  2. Atrial arrhythmias may be secondary to his recent pulmonary embolus though difficult to assess.  We will treat with apixaban at discharge as outlined above. Given that he is asymptomatic, will plan rate control for now; can consider DCCV in the future if needed.  3 ? Cardiomyopathy-I personally reviewed echo; images technically difficult; EF appears to be in the 50-55 range. Will not pursue further evaluation.   4 Elevated troponin-likely related to pulmonary embolus; no ischemic symptoms; no plans for further cardiac eval.   5 clearance meniscus per orthopedics.  Needs mobilization and physical therapy.  For questions or updates, please contact [facility] Please consult [email or web address] for contact info under          Signed, Kirk Ruths, MD  04/07/2019, 9:33 AM

## 2019-04-07 NOTE — TOC Initial Note (Signed)
Transition of Care [facility]) - Initial/Assessment Note    Patient Details  Name: Willie Cisneros MRN: [id number] Date of Birth: [birth year]  [facility]) CM/SW Contact:    Lanier Clam, RN Phone Number: 04/07/2019, 3:51 PM  Clinical Narrative:  PT recc CIR-await outcome from CIR assessment.                 Expected Discharge Plan: IP Rehab Facility Barriers to Discharge: Continued Medical Work up   Patient Goals and CMS Choice        Expected Discharge Plan and Services Expected Discharge Plan: IP Rehab Facility   Discharge Planning Services: CM Consult                                          Prior Living Arrangements/Services     Patient language and need for interpreter reviewed:: Yes Do you feel safe going back to the place where you live?: Yes      Need for Family Participation in Patient Care: No (Comment) Care giver support system in place?: Yes (comment)   Criminal Activity/Legal Involvement Pertinent to Current Situation/Hospitalization: No - Comment as needed  Activities of Daily Living Home Assistive Devices/Equipment: None ADL Screening (condition at time of admission) Patient's cognitive ability adequate to safely complete daily activities?: Yes Is the patient deaf or have difficulty hearing?: No Does the patient have difficulty seeing, even when wearing glasses/contacts?: No Does the patient have difficulty concentrating, remembering, or making decisions?: No Patient able to express need for assistance with ADLs?: Yes Does the patient have difficulty dressing or bathing?: No Independently performs ADLs?: Yes (appropriate for developmental age) Does the patient have difficulty walking or climbing stairs?: Yes Weakness of Legs: Both Weakness of Arms/Hands: None  Permission Sought/Granted Permission sought to share information with : Case Manager Permission granted to share information with : Yes, Verbal Permission Granted  Share  Information with NAME: Case manager           Emotional Assessment Appearance:: Appears stated age Attitude/Demeanor/Rapport: Gracious Affect (typically observed): Accepting Orientation: : Oriented to Self, Oriented to Place, Oriented to  Time, Oriented to Situation Alcohol / Substance Use: Not Applicable Psych Involvement: No (comment)  Admission diagnosis:  Pulmonary embolism (HCC) [I26.99] Acute pulmonary embolism with acute cor pulmonale, unspecified pulmonary embolism type (HCC) [I26.09] Patient Active Problem List   Diagnosis Date Noted  . Pulmonary embolism (HCC) 04/03/2019   PCP:  Gweneth Dimitri, MD Pharmacy:   [id number] [place], [facility] - [phone] [place] [address] Phone: [phone] Fax: [phone]     Social Determinants of Health (SDOH) Interventions    Readmission Risk Interventions No flowsheet data found.

## 2019-04-07 NOTE — Progress Notes (Addendum)
ANTICOAGULATION CONSULT NOTE - Follow Up Consult  Pharmacy Consult for heparin--> Eliquis Indication: acute pulmonary embolus; Atrial fibrillation  Allergies  Allergen Reactions  . Penicillins Anaphylaxis    Did it involve swelling of the face/tongue/throat, SOB, or low BP? Yes Did it involve sudden or severe rash/hives, skin peeling, or any reaction on the inside of your mouth or nose? Yes Did you need to seek medical attention at a hospital or doctor's office? Yes When did it last happen?Over 10 years ago If all above answers are "NO", may proceed with cephalosporin use.  . Sulfa Antibiotics Anaphylaxis and Rash  . Clindamycin Nausea Only  . Contrast Media  [Iodinated Diagnostic Agents] Nausea Only  . Econazole Nitrate Itching and Other (See Comments)    Burning  . Erythromycin Nausea Only  . Oxycodone Nausea And Vomiting    Patient Measurements: Height: 5\' 10"  (177.8 cm) Weight: 181 lb 3.5 oz (82.2 kg) IBW/kg (Calculated) : 73 Heparin Dosing Weight: 83 kg  Vital Signs: Temp: 98.6 F (37 C) (03/17 0446) Temp Source: Oral (03/17 0446) BP: 111/84 (03/17 0446) Pulse Rate: 96 (03/17 0446)  Labs: Recent Labs    04/05/19 0451 04/05/19 2050 04/06/19 0619 04/06/19 1746 04/07/19 0452  HGB 12.6*  --  12.1*  --  12.2*  HCT 38.8*  --  37.2*  --  37.3*  PLT 144*  --  136*  --  132*  HEPARINUNFRC 0.37   < > 0.29* 0.29* 0.45  CREATININE  --   --  0.93  --   --    < > = values in this interval not displayed.    Estimated Creatinine Clearance: 54.5 mL/min (by C-G formula based on SCr of 0.93 mg/dL).   Assessment: Patient's a 86 y.o M presented to the ED on 3/13 with c/o CP and SOB. Chest CTA showed acute PE with concern for right heart strain. He's currently on heparin drip for PE and afib.  - s/p right knee aspiration on 3/16  Today, 04/07/2019: - heparin level remains therapeutic is therapeutic at 0.45 - cbc stable - no bleeding documented   Goal of Therapy:   Heparin level 0.3-0.7 units/ml Monitor platelets by anticoagulation protocol: Yes   Plan:  - continue heparin drip at 1750 units/hr - daily heparin level - monitor for s/s bleeding - If to transition patient to Eliquis, recom. 10 mg bid x7 days, then 5 mg bid   Loyd Salvador P 04/07/2019,9:53 AM  _________________________________________  Adden: Per Dr. 04/09/2019 request -- transition patient Eliquis today - d/c heparin drop -  Start Eliquis 10 mg bid x7 days, then 5 mg bid  Dennison Nancy, PharmD, BCPS 04/07/2019 10:20 AM

## 2019-04-07 NOTE — Evaluation (Signed)
Physical Therapy Evaluation Patient Details Name: Willie Cisneros MRN: [id number] DOB: 03-16-1926 Today's Date: 04/07/2019   History of Present Illness  86 year old male patient admitted s/p fall with injury to Left foot strain, Right knee and facial brusing. He has glaucoma and had increasing cough at time of admission. Mild shortness of breath with min-mod exertion.  Clinical Impression  Patient resting in bed when PT arrived. He resides with spouse, but is noted to be the caregiver for her and "he has no one else that can help"- she has advanced dementia. They live in one story home with 2 outside steps, with grab bars (not handrails), walk in shower, elevated commode height, grab bars in shower. He uses a cane "sometimes"- but one of his canes was "lost between ED and ICU when he was first admitted". He enjoys racquetball, but injured his RUE rotator cuff. He still has to be in boot for foot/ankle injury, and states that his right knee was recently drained due to fluid. Should benefit from PT to address optimal functional outcomes.    Follow Up Recommendations CIR(of concern, patient is caregiver for SPOUSE, and she has advanced dementia- he has been independent in all ADLs. Due to glaucoma, he is limited in driving.)    Equipment Recommendations  Rolling walker with 5" wheels    Recommendations for Other Services       Precautions / Restrictions Precautions Precautions: Fall Precaution Comments: Monitor for any shortness of breath Restrictions Weight Bearing Restrictions: No      Mobility  Bed Mobility Overal bed mobility: Needs Assistance Bed Mobility: Rolling;Sidelying to Sit;Supine to Sit;Sit to Supine;Sit to Sidelying Rolling: Mod assist Sidelying to sit: Mod assist Supine to sit: Mod assist Sit to supine: Mod assist Sit to sidelying: Mod assist;Min assist    Transfers Overall transfer level: Needs assistance Equipment used: Rolling walker (2 wheeled) Transfers: Sit  to/from Stand Sit to Stand: Mod assist         General transfer comment: Recommend assist of 2 for safety and advancing in distance.  Ambulation/Gait Ambulation/Gait assistance: Mod assist Gait Distance (Feet): 12 Feet(6 side steps x 2) Assistive device: Rolling walker (2 wheeled) Gait Pattern/deviations: Shuffle;Wide base of support   Gait velocity interpretation: <1.8 ft/sec, indicate of risk for recurrent falls    Stairs            Wheelchair Mobility    Modified Rankin (Stroke Patients Only)       Balance Overall balance assessment: Needs assistance   Sitting balance-Leahy Scale: Good     Standing balance support: Single extremity supported Standing balance-Leahy Scale: Fair                               Pertinent Vitals/Pain Pain Assessment: No/denies pain    Home Living Family/patient expects to be discharged to:: Private residence Living Arrangements: Spouse/significant other Available Help at Discharge: Family(states he has a son that is local, but is "very short term when it comes to helping out") Type of Home: House Home Access: Stairs to enter Entrance Stairs-Rails: Can reach both;Left;Right Entrance Stairs-Number of Steps: 2 from garage with no handrails, but "grab bars- 2" Home Layout: One level Home Equipment: Cane - single point(He states he has a "walking stick type cane" as well- that the cane is wife gave him was lost between ED and ICU.)      Prior Function Level of Independence: Independent  Hand Dominance   Dominant Hand: Right    Extremity/Trunk Assessment        Lower Extremity Assessment Lower Extremity Assessment: Generalized weakness    Cervical / Trunk Assessment Cervical / Trunk Assessment: Kyphotic  Communication   Communication: No difficulties  Cognition Arousal/Alertness: Awake/alert Behavior During Therapy: WFL for tasks assessed/performed Overall Cognitive Status: Within  Functional Limits for tasks assessed                                 General Comments: He is very motivated and would benefit from [facility] if he had someone to care for his wife- but currently he is the only caregiver.      General Comments General comments (skin integrity, edema, etc.): Monitor closely for postural awareness as he tends to FF trunk- slight posterior lean    Exercises General Exercises - Lower Extremity Ankle Circles/Pumps: AROM;Supine;10 reps Quad Sets: AROM;Supine;10 reps Gluteal Sets: AROM;Supine;10 reps Short Arc Quad: AROM;Supine;10 reps Long Arc Quad: Supine;10 reps Heel Slides: AROM;Supine;10 reps Hip ABduction/ADduction: AROM;Supine;10 reps Straight Leg Raises: AROM;Supine;10 reps Other Exercises Other Exercises: Printed sheet of ex was issued to patient with full instructions and return demo   Assessment/Plan    PT Assessment Patient needs continued PT services  PT Problem List Decreased strength;Decreased activity tolerance;Decreased balance;Decreased mobility       PT Treatment Interventions DME instruction;Gait training;Functional mobility training;Therapeutic activities;Patient/family education;Therapeutic exercise    PT Goals (Current goals can be found in the Care Plan section)  Acute Rehab PT Goals Patient Stated Goal: Just to get well- I could possibly go to outpatient PT Goal Formulation: With patient Time For Goal Achievement: 04/21/19 Potential to Achieve Goals: Good    Frequency Min 3X/week   Barriers to discharge        Co-evaluation               AM-PAC PT "6 Clicks" Mobility  Outcome Measure Help needed turning from your back to your side while in a flat bed without using bedrails?: A Lot Help needed moving from lying on your back to sitting on the side of a flat bed without using bedrails?: A Lot Help needed moving to and from a bed to a chair (including a wheelchair)?: A Lot Help needed standing up from a  chair using your arms (e.g., wheelchair or bedside chair)?: A Lot Help needed to walk in hospital room?: A Lot Help needed climbing 3-5 steps with a railing? : A Lot 6 Click Score: 12    End of Session   Activity Tolerance: Patient tolerated treatment well Patient left: in bed   PT Visit Diagnosis: Unsteadiness on feet (R26.81);Other abnormalities of gait and mobility (R26.89);Muscle weakness (generalized) (M62.81)    Time: 8115-7262 PT Time Calculation (min) (ACUTE ONLY): 51 min   Charges:   PT Evaluation $PT Eval Moderate Complexity: 1 Mod PT Treatments $Therapeutic Exercise: 8-22 mins $Therapeutic Activity: 8-22 mins        Harriett Rush, PT # [phone] CGV cell  Ephriam Jenkins 04/07/2019, 1:24 PM

## 2019-04-07 NOTE — Progress Notes (Signed)
Rehab Admissions Coordinator Note:  Patient was screened by Clois Dupes for appropriateness for an Inpatient Acute Rehab Consult per therapy recs.   At this time, we are recommending Inpatient Rehab consult. I will place order per protocol.  Clois Dupes RN MSN 04/07/2019, 3:53 PM  I can be reached at [phone].

## 2019-04-07 NOTE — Progress Notes (Signed)
PROGRESS NOTE    Willie Cisneros  WHQ:[id number] DOB: 09/28/1926 DOA: 04/03/2019 PCP: Cari Caraway, MD   Brief Narrative: Willie Cisneros is a 86 y.o. male with a history of glaucoma that presented secondary to dyspnea, chest pain and cough and found to have an acute PE. He was started on IV heparin. During hospital course, he developed atrial fibrillation with RVR which is controlled on metoprolol.   Assessment & Plan:   Active Problems:   Pulmonary embolism ([place])   Pulmonary embolism Patient with some evidence of mild RV dysfunction on Transthoracic Echocardiogram. HS Troponin elevated to a peak of 450. Patient managed on heparin drip -Transition to Eliquis and discontinue heparin drip  Right LE weakness Likely secondary to knee effusion/meniscus tear/popliteus tendon injury. Orthopedic surgery consulted and aspirated knee on 3/16. Some improvement in pain mostly with movement. -PT eval pending -Orthopedic surgery recommendations  Atrial fibrillation with RVR Cardiology consulted in setting of new cardiomyopathy -Continue metoprolol per cardiology recommendations -Eliquis as mentioned above -Cardiology recommendations: outpatient follow-up. Consider DCCV in future   DVT prophylaxis: Eliquis Code Status:   Code Status: Full Code Family Communication: None at bedside Disposition Plan: Discharge likely in 24 hours pending PT recommendations. Likely discharge home as patient is wife's caregiver (she has advanced dementia). If requires therapy, will discuss with patient.   Consultants:   Orthopedic surgery  Cardiology  Procedures:   TRANSTHORACIC ECHOCARDIOGRAM (3/13) IMPRESSIONS    1. Left ventricular ejection fraction, by estimation, is 40 to 45% with  beat to beat variability. The left ventricle has mildly decreased  function. Left ventricular endocardial border not optimally defined to  evaluate regional wall motion. There is  moderate left ventricular  hypertrophy.  2. Right ventricular systolic function is mildly reduced. The right  ventricular size is normal. Tricuspid regurgitation signal is inadequate  for assessing PA pressure.  3. Left atrial size was mildly dilated.  4. The mitral valve is normal in structure. Trivial mitral valve  regurgitation. No evidence of mitral stenosis.  5. The aortic valve is abnormal. Aortic valve regurgitation is not  visualized. No aortic stenosis is present.  6. Aortic dilatation noted. There is borderline dilatation of the  ascending aorta measuring 39 mm.  7. The inferior vena cava is normal in size with greater than 50%  respiratory variability, suggesting right atrial pressure of 3 mmHg.  Antimicrobials:  None    Subjective: No dyspnea or chest pain. Concerned about his functional status/weakness  Objective: Vitals:   04/07/19 0446 04/07/19 0500 04/07/19 1007 04/07/19 1308  BP: 111/84  107/84   Pulse: 96  73   Resp: (!) 22  18   Temp: 98.6 F (37 C)  97.8 F (36.6 C)   TempSrc: Oral  Oral   SpO2: 95%   95%  Weight:  82.2 kg    Height:        Intake/Output Summary (Last 24 hours) at 04/07/2019 1319 Last data filed at 04/07/2019 0300 Gross per 24 hour  Intake 138.4 ml  Output 600 ml  Net -461.6 ml   Filed Weights   04/06/19 0408 04/06/19 0540 04/07/19 0500  Weight: 82.3 kg 81.6 kg 82.2 kg    Examination:  General exam: Appears calm and comfortable Respiratory system: Clear to auscultation. Respiratory effort normal. Cardiovascular system: S1 & S2 heard, RRR. No murmurs, rubs, gallops or clicks. Gastrointestinal system: Abdomen is nondistended, soft and nontender. No organomegaly or masses felt. Normal bowel sounds heard. Central nervous system:  Alert and oriented. No focal neurological deficits. Extremities: No calf tenderness. RLE edema. Right knee in ACE bandage Skin: No cyanosis. No rashes Psychiatry: Judgement and insight appear normal. Mood & affect  appropriate.     Data Reviewed: I have personally reviewed following labs and imaging studies  CBC: Recent Labs  Lab 04/03/19 0917 04/04/19 0346 04/05/19 0451 04/06/19 0619 04/07/19 0452  WBC 12.0* 10.0 9.9 9.9 8.3  HGB 14.3 12.8* 12.6* 12.1* 12.2*  HCT 44.0 40.8 38.8* 37.2* 37.3*  MCV 95.9 96.7 95.1 94.4 92.8  PLT 127* 141* 144* 136* 132*   Basic Metabolic Panel: Recent Labs  Lab 04/03/19 0917 04/04/19 0346 04/05/19 1112 04/06/19 0619  NA 138 138  --  135  K 4.6 4.1  --  4.2  CL 104 102  --  99  CO2 24 25  --  23  GLUCOSE 146* 131*  --  134*  BUN 17 18  --  16  CREATININE 1.00 0.96  --  0.93  CALCIUM 9.0 8.6*  --  8.4*  MG  --   --  2.2  --    GFR: Estimated Creatinine Clearance: 54.5 mL/min (by C-G formula based on SCr of 0.93 mg/dL). Liver Function Tests: No results for input(s): AST, ALT, ALKPHOS, BILITOT, PROT, ALBUMIN in the last 168 hours. No results for input(s): LIPASE, AMYLASE in the last 168 hours. No results for input(s): AMMONIA in the last 168 hours. Coagulation Profile: No results for input(s): INR, PROTIME in the last 168 hours. Cardiac Enzymes: No results for input(s): CKTOTAL, CKMB, CKMBINDEX, TROPONINI in the last 168 hours. BNP (last 3 results) No results for input(s): PROBNP in the last 8760 hours. HbA1C: No results for input(s): HGBA1C in the last 72 hours. CBG: No results for input(s): GLUCAP in the last 168 hours. Lipid Profile: No results for input(s): CHOL, HDL, LDLCALC, TRIG, CHOLHDL, LDLDIRECT in the last 72 hours. Thyroid Function Tests: Recent Labs    04/05/19 1112  TSH 1.474   Anemia Panel: No results for input(s): VITAMINB12, FOLATE, FERRITIN, TIBC, IRON, RETICCTPCT in the last 72 hours. Sepsis Labs: No results for input(s): PROCALCITON, LATICACIDVEN in the last 168 hours.  Recent Results (from the past 240 hour(s))  Respiratory Panel by RT PCR (Flu A&B, Covid) - Nasopharyngeal Swab     Status: None   Collection Time:  04/03/19 12:09 PM   Specimen: Nasopharyngeal Swab  Result Value Ref Range Status   SARS Coronavirus 2 by RT PCR NEGATIVE NEGATIVE Final    Comment: (NOTE) SARS-CoV-2 target nucleic acids are NOT DETECTED. The SARS-CoV-2 RNA is generally detectable in upper respiratoy specimens during the acute phase of infection. The lowest concentration of SARS-CoV-2 viral copies this assay can detect is 131 copies/mL. A negative result does not preclude SARS-Cov-2 infection and should not be used as the sole basis for treatment or other patient management decisions. A negative result may occur with  improper specimen collection/handling, submission of specimen other than nasopharyngeal swab, presence of viral mutation(s) within the areas targeted by this assay, and inadequate number of viral copies (<131 copies/mL). A negative result must be combined with clinical observations, patient history, and epidemiological information. The expected result is Negative. Fact Sheet for Patients:  https://www.moore.com/ Fact Sheet for Healthcare Providers:  https://www.young.biz/ This test is not yet ap proved or cleared by the [place] FDA and  has been authorized for detection and/or diagnosis of SARS-CoV-2 by FDA under an Emergency Use Authorization (EUA). This EUA  will remain  in effect (meaning this test can be used) for the duration of the COVID-19 declaration under Section 564(b)(1) of the Act, 21 U.S.C. section 360bbb-3(b)(1), unless the authorization is terminated or revoked sooner.    Influenza A by PCR NEGATIVE NEGATIVE Final   Influenza B by PCR NEGATIVE NEGATIVE Final    Comment: (NOTE) The Xpert Xpress SARS-CoV-2/FLU/RSV assay is intended as an aid in  the diagnosis of influenza from Nasopharyngeal swab specimens and  should not be used as a sole basis for treatment. Nasal washings and  aspirates are unacceptable for Xpert Xpress SARS-CoV-2/FLU/RSV    testing. Fact Sheet for Patients: https://www.moore.com/ Fact Sheet for Healthcare Providers: https://www.young.biz/ This test is not yet approved or cleared by the [place] FDA and  has been authorized for detection and/or diagnosis of SARS-CoV-2 by  FDA under an Emergency Use Authorization (EUA). This EUA will remain  in effect (meaning this test can be used) for the duration of the  Covid-19 declaration under Section 564(b)(1) of the Act, 21  U.S.C. section 360bbb-3(b)(1), unless the authorization is  terminated or revoked. Performed at [facility], [address] [place], [place] [address]   MRSA PCR Screening     Status: None   Collection Time: 04/03/19  3:42 PM   Specimen: Nasal Mucosa; Nasopharyngeal  Result Value Ref Range Status   MRSA by PCR NEGATIVE NEGATIVE Final    Comment:        The GeneXpert MRSA Assay (FDA approved for NASAL specimens only), is one component of a comprehensive MRSA colonization surveillance program. It is not intended to diagnose MRSA infection nor to guide or monitor treatment for MRSA infections. Performed at [facility], [address], [place], [place] [address]          Radiology Studies: MR BRAIN WO CONTRAST  Result Date: 04/05/2019 CLINICAL DATA:  Post fall, possible CVA EXAM: MRI HEAD WITHOUT CONTRAST TECHNIQUE: Multiplanar, multiecho pulse sequences of the brain and surrounding structures were obtained without intravenous contrast. COMPARISON:  None. FINDINGS: Brain: There is no acute infarction or intracranial hemorrhage. There is no intracranial mass, mass effect, or edema. There is no hydrocephalus or extra-axial fluid collection. Patchy foci of T2 hyperintensity in the supratentorial white matter are but may mild chronic microvascular ischemic changes. 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 Vascular: Major vessel flow voids  at the skull base are preserved. Skull and upper cervical spine: Normal marrow signal is preserved. Sinuses/Orbits: Paranasal sinuses are aerated. Bilateral lens replacements. Other: Sella is unremarkable.  Mastoid air cells are clear. IMPRESSION: No evidence of recent infarction, hemorrhage, or mass. Mild chronic microvascular ischemic changes. Electronically Signed   By: Guadlupe Spanish M.D.   On: 04/05/2019 14:55   MR ZSMOL RIGHT WO CONTRAST  Result Date: 04/05/2019 CLINICAL DATA:  Right thigh pain. Fall week ago. EXAM: MRI OF THE RIGHT FEMUR WITHOUT CONTRAST TECHNIQUE: Multiplanar, multisequence MR imaging of the right femur was performed. No intravenous contrast was administered. COMPARISON:  Right knee x-rays dated April 03, 2019. FINDINGS: Bones/Joint/Cartilage No marrow signal abnormality. No fracture or dislocation. Normal alignment. Small right knee joint effusion. Trace fluid in the right greater trochanteric bursa. Muscles and Tendons Intact. Mild fatty infiltration of the semimembranosus and biceps femoris muscles. No muscle edema. Soft tissue No fluid collection or hematoma. No soft tissue mass. IMPRESSION: No acute injury of the right femur or thigh. Electronically Signed   By: Obie Dredge  M.D.   On: 04/05/2019 15:20   MR KNEE RIGHT WO CONTRAST  Result Date: 04/06/2019 CLINICAL DATA:  Right knee pain after fall 1 week ago EXAM: MRI OF THE RIGHT KNEE WITHOUT CONTRAST TECHNIQUE: Multiplanar, multisequence MR imaging of the knee was performed. No intravenous contrast was administered. COMPARISON:  X-ray 04/03/2019. Right femur MRI 04/05/2019 FINDINGS: MENISCI Medial meniscus: Focal radial tear at the posterior horn-body junction (series 5, image 22; series 10, image 6). Possible nondisplaced horizontal tear of the posterior horn (series 10, image 7). Mild intrasubstance degeneration of the posterior horn and body. Lateral meniscus:  Intact. LIGAMENTS Cruciates:  Intact ACL and PCL.  Collaterals: Medial collateral ligament is intact. Lateral collateral ligament complex is intact. CARTILAGE Patellofemoral: Trochlear chondral thinning without focal defect. Patellar cartilage intact. Medial: Mild partial-thickness cartilage loss of the medial femorotibial compartment. Lateral:  No chondral defect. Joint:  Moderate joint effusion. Unremarkable fat pads. Popliteal Fossa: Distal popliteus tendon is thickened and heterogeneous (series 8, image 10) with intramuscular edema within the popliteus muscle belly. No Baker's cyst. Extensor Mechanism: Intact quadriceps tendon and patellar tendon. Mild distal patellar tendinosis. Bones: No focal marrow signal abnormality. No fracture or dislocation. Other: Mild prepatellar soft tissue edema. Small ganglion posteromedial to the fibular head. IMPRESSION: 1. Focal radial tear at the posterior horn-body junction of the medial meniscus. Probable nondisplaced horizontal tear component involving the posterior horn. 2. Popliteus tendon injury and muscle strain. 3. Moderate joint effusion. Electronically Signed   By: Duanne Guess D.O.   On: 04/06/2019 10:51        Scheduled Meds:  apixaban  10 mg Oral BID   Followed by   Melene Muller ON 04/14/2019] apixaban  5 mg Oral BID   latanoprost  1 drop Both Eyes QHS   mouth rinse  15 mL Mouth Rinse BID   metoprolol tartrate  75 mg Oral BID   Continuous Infusions:   LOS: 4 days     Jacquelin Hawking, MD [facility] 04/07/2019, 1:19 PM  If 7PM-7AM, please contact night-coverage [email or web address]

## 2019-04-08 ENCOUNTER — Telehealth: Payer: Self-pay | Admitting: Physician Assistant

## 2019-04-08 LAB — CBC
HCT: 38.8 % — ABNORMAL LOW (ref 39.0–52.0)
Hemoglobin: 12.7 g/dL — ABNORMAL LOW (ref 13.0–17.0)
MCH: 30.3 pg (ref 26.0–34.0)
MCHC: 32.7 g/dL (ref 30.0–36.0)
MCV: 92.6 fL (ref 80.0–100.0)
Platelets: 144 10*3/uL — ABNORMAL LOW (ref 150–400)
RBC: 4.19 MIL/uL — ABNORMAL LOW (ref 4.22–5.81)
RDW: 13.2 % (ref 11.5–15.5)
WBC: 8.6 10*3/uL (ref 4.0–10.5)
nRBC: 0 % (ref 0.0–0.2)

## 2019-04-08 MED ORDER — METOPROLOL TARTRATE 50 MG PO TABS
100.0000 mg | ORAL_TABLET | Freq: Two times a day (BID) | ORAL | Status: DC
  Administered 2019-04-08 – 2019-04-09 (×3): 100 mg via ORAL
  Filled 2019-04-08 (×3): qty 2

## 2019-04-08 NOTE — Progress Notes (Signed)
Inpatient Rehabilitation-Admissions Coordinator   [facility] spoke with pt via phone today for IP Rehab assessment. Pt very pleasant to speak with and appears to be a great candidate based on his prior activity level and current functional level at this time. We discussed expectations, estimated LOS, anticipated outcomes, and DC support. With pt's permission I have spoken to his son to confirm intermittent supervision. The patient is very much interested in CIR at this time. I did discuss that I do not have a bed today for him in IP Rehab but that I will follow up tomorrow to see if we have a possible bed opening for him then. Pt very appreciaitive. Will follow up tomorrow.   Cheri Rous, OTR/L  Rehab Admissions Coordinator  [phone] 04/08/2019 2:25 PM

## 2019-04-08 NOTE — Telephone Encounter (Signed)
TOC Willie Cisneros- Please call Willie Cisneros- Pt have an appt with Tereso Newcomer on 04-20-19

## 2019-04-08 NOTE — Progress Notes (Signed)
PROGRESS NOTE    Willie Cisneros  BTD:[id number] DOB: 1926/05/03 DOA: 04/03/2019 PCP: Gweneth Dimitri, MD   Brief Narrative: Willie Cisneros is a 86 y.o. male with a history of glaucoma that presented secondary to dyspnea, chest pain and cough and found to have an acute PE. He was started on IV heparin. During hospital course, he developed atrial fibrillation with RVR which is controlled on metoprolol.   Assessment & Plan:   Active Problems:   Pulmonary embolism ([facility])   Pulmonary embolism Patient with some evidence of mild RV dysfunction on Transthoracic Echocardiogram. HS Troponin elevated to a peak of 450. Patient managed on heparin drip -Transition to Eliquis and discontinue heparin drip  Right LE weakness Likely secondary to knee effusion/meniscus tear/popliteus tendon injury. Orthopedic surgery consulted and aspirated knee on 3/16. Some improvement in pain mostly with movement. -PT recommendations: CIR -Orthopedic surgery recommendations: WBAT  Atrial fibrillation with RVR Cardiology consulted in setting of new cardiomyopathy -Continue metoprolol per cardiology recommendations -Eliquis as mentioned above -Cardiology recommendations: outpatient follow-up. Metoprolol 100 mg BID. Eliquis. Consider DCCV in future   DVT prophylaxis: Eliquis Code Status:   Code Status: Full Code Family Communication: None at bedside Disposition Plan: Discharge to CIR pending evaluation and insurance approval   Consultants:   Orthopedic surgery  Cardiology  Procedures:   TRANSTHORACIC ECHOCARDIOGRAM (3/13) IMPRESSIONS    1. Left ventricular ejection fraction, by estimation, is 40 to 45% with  beat to beat variability. The left ventricle has mildly decreased  function. Left ventricular endocardial border not optimally defined to  evaluate regional wall motion. There is  moderate left ventricular hypertrophy.  2. Right ventricular systolic function is mildly reduced. The right    ventricular size is normal. Tricuspid regurgitation signal is inadequate  for assessing PA pressure.  3. Left atrial size was mildly dilated.  4. The mitral valve is normal in structure. Trivial mitral valve  regurgitation. No evidence of mitral stenosis.  5. The aortic valve is abnormal. Aortic valve regurgitation is not  visualized. No aortic stenosis is present.  6. Aortic dilatation noted. There is borderline dilatation of the  ascending aorta measuring 39 mm.  7. The inferior vena cava is normal in size with greater than 50%  respiratory variability, suggesting right atrial pressure of 3 mmHg.  Antimicrobials:  None    Subjective: No issues overnight. Performing his strengthening exercises.  Objective: Vitals:   04/08/19 0500 04/08/19 0623 04/08/19 0901 04/08/19 1350  BP:  111/76 126/84 121/73  Pulse:  94 99 97  Resp:   18 (!) 24  Temp:  98.1 F (36.7 C) 97.9 F (36.6 C) (!) 97.5 F (36.4 C)  TempSrc:  Oral Oral Oral  SpO2:  91% 94% 95%  Weight: 81 kg     Height:        Intake/Output Summary (Last 24 hours) at 04/08/2019 1410 Last data filed at 04/08/2019 1300 Gross per 24 hour  Intake 320 ml  Output 1250 ml  Net -930 ml   Filed Weights   04/06/19 0540 04/07/19 0500 04/08/19 0500  Weight: 81.6 kg 82.2 kg 81 kg    Examination:  General exam: Appears calm and comfortable Respiratory system: Clear to auscultation. Respiratory effort normal. Cardiovascular system: S1 & S2 heard, RRR. No murmurs, rubs, gallops or clicks. Gastrointestinal system: Abdomen is nondistended, soft and nontender. No organomegaly or masses felt. Normal bowel sounds heard. Central nervous system: Alert and oriented. No focal neurological deficits. Extremities: RLE edema. No  calf tenderness Skin: No cyanosis. No rashes Psychiatry: Judgement and insight appear normal. Mood & affect appropriate.     Data Reviewed: I have personally reviewed following labs and imaging  studies  CBC: Recent Labs  Lab 04/04/19 0346 04/05/19 0451 04/06/19 0619 04/07/19 0452 04/08/19 0513  WBC 10.0 9.9 9.9 8.3 8.6  HGB 12.8* 12.6* 12.1* 12.2* 12.7*  HCT 40.8 38.8* 37.2* 37.3* 38.8*  MCV 96.7 95.1 94.4 92.8 92.6  PLT 141* 144* 136* 132* 790*   Basic Metabolic Panel: Recent Labs  Lab 04/03/19 0917 04/04/19 0346 04/05/19 1112 04/06/19 0619  NA 138 138  --  135  K 4.6 4.1  --  4.2  CL 104 102  --  99  CO2 24 25  --  23  GLUCOSE 146* 131*  --  134*  BUN 17 18  --  16  CREATININE 1.00 0.96  --  0.93  CALCIUM 9.0 8.6*  --  8.4*  MG  --   --  2.2  --    GFR: Estimated Creatinine Clearance: 54.5 mL/min (by C-G formula based on SCr of 0.93 mg/dL). Liver Function Tests: No results for input(s): AST, ALT, ALKPHOS, BILITOT, PROT, ALBUMIN in the last 168 hours. No results for input(s): LIPASE, AMYLASE in the last 168 hours. No results for input(s): AMMONIA in the last 168 hours. Coagulation Profile: No results for input(s): INR, PROTIME in the last 168 hours. Cardiac Enzymes: No results for input(s): CKTOTAL, CKMB, CKMBINDEX, TROPONINI in the last 168 hours. BNP (last 3 results) No results for input(s): PROBNP in the last 8760 hours. HbA1C: No results for input(s): HGBA1C in the last 72 hours. CBG: No results for input(s): GLUCAP in the last 168 hours. Lipid Profile: No results for input(s): CHOL, HDL, LDLCALC, TRIG, CHOLHDL, LDLDIRECT in the last 72 hours. Thyroid Function Tests: No results for input(s): TSH, T4TOTAL, FREET4, T3FREE, THYROIDAB in the last 72 hours. Anemia Panel: No results for input(s): VITAMINB12, FOLATE, FERRITIN, TIBC, IRON, RETICCTPCT in the last 72 hours. Sepsis Labs: No results for input(s): PROCALCITON, LATICACIDVEN in the last 168 hours.  Recent Results (from the past 240 hour(s))  Respiratory Panel by RT PCR (Flu A&B, Covid) - Nasopharyngeal Swab     Status: None   Collection Time: 04/03/19 12:09 PM   Specimen: Nasopharyngeal  Swab  Result Value Ref Range Status   SARS Coronavirus 2 by RT PCR NEGATIVE NEGATIVE Final    Comment: (NOTE) SARS-CoV-2 target nucleic acids are NOT DETECTED. The SARS-CoV-2 RNA is generally detectable in upper respiratoy specimens during the acute phase of infection. The lowest concentration of SARS-CoV-2 viral copies this assay can detect is 131 copies/mL. A negative result does not preclude SARS-Cov-2 infection and should not be used as the sole basis for treatment or other patient management decisions. A negative result may occur with  improper specimen collection/handling, submission of specimen other than nasopharyngeal swab, presence of viral mutation(s) within the areas targeted by this assay, and inadequate number of viral copies (<131 copies/mL). A negative result must be combined with clinical observations, patient history, and epidemiological information. The expected result is Negative. Fact Sheet for Patients:  PinkCheek.be Fact Sheet for Healthcare Providers:  GravelBags.it This test is not yet ap proved or cleared by the [place] FDA and  has been authorized for detection and/or diagnosis of SARS-CoV-2 by FDA under an Emergency Use Authorization (EUA). This EUA will remain  in effect (meaning this test can be used) for the duration  of the COVID-19 declaration under Section 564(b)(1) of the Act, 21 U.S.C. section 360bbb-3(b)(1), unless the authorization is terminated or revoked sooner.    Influenza A by PCR NEGATIVE NEGATIVE Final   Influenza B by PCR NEGATIVE NEGATIVE Final    Comment: (NOTE) The Xpert Xpress SARS-CoV-2/FLU/RSV assay is intended as an aid in  the diagnosis of influenza from Nasopharyngeal swab specimens and  should not be used as a sole basis for treatment. Nasal washings and  aspirates are unacceptable for Xpert Xpress SARS-CoV-2/FLU/RSV  testing. Fact Sheet for  Patients: https://www.moore.com/ Fact Sheet for Healthcare Providers: https://www.young.biz/ This test is not yet approved or cleared by the [place] FDA and  has been authorized for detection and/or diagnosis of SARS-CoV-2 by  FDA under an Emergency Use Authorization (EUA). This EUA will remain  in effect (meaning this test can be used) for the duration of the  Covid-19 declaration under Section 564(b)(1) of the Act, 21  U.S.C. section 360bbb-3(b)(1), unless the authorization is  terminated or revoked. Performed at [facility], [address], [place], [place] [address]   MRSA PCR Screening     Status: None   Collection Time: 04/03/19  3:42 PM   Specimen: Nasal Mucosa; Nasopharyngeal  Result Value Ref Range Status   MRSA by PCR NEGATIVE NEGATIVE Final    Comment:        The GeneXpert MRSA Assay (FDA approved for NASAL specimens only), is one component of a comprehensive MRSA colonization surveillance program. It is not intended to diagnose MRSA infection nor to guide or monitor treatment for MRSA infections. Performed at [facility], [address], [place], [place] [address]          Radiology Studies: No results found.      Scheduled Meds: . apixaban  10 mg Oral BID   Followed by  . [START ON 04/14/2019] apixaban  5 mg Oral BID  . latanoprost  1 drop Both Eyes QHS  . mouth rinse  15 mL Mouth Rinse BID  . metoprolol tartrate  100 mg Oral BID   Continuous Infusions:   LOS: 5 days     Jacquelin Hawking, MD [facility] 04/08/2019, 2:10 PM  If 7PM-7AM, please contact night-coverage [email or web address]

## 2019-04-08 NOTE — PMR Pre-admission (Addendum)
PMR Admission Coordinator Pre-Admission Assessment  Patient: Willie Cisneros is an 86 y.o., male MRN: [id number] DOB: 07-Jul-1926 Height: 5' 10"  (177.8 cm) Weight: 80.3 kg  Insurance Information HMO:     PPO:      PCP:      IPA:      80/20: yes     OTHER:  PRIMARY: Medicare A and B       Policy#: [id number]      Subscriber: Patient CM Name:       Phone#:      Fax#:  Pre-Cert#:       Employer:  Benefits:  Phone #: online     Name: verified eligibility online via [place] on 04/09/19 Eff. Date: Part A and B effective 03/21/1993     Deduct: $1,484      Out of Pocket Max: NA      Life Max: NA CIR: Covered per Medicare guidelines once yearly deductible has been met      SNF: days 1-20, 100% coverage, days 21-100, 80% coverage Outpatient: 80%     Co-Pay: 20% Home Health: 100%      Co-Pay:  DME: 80%     Co-Pay: 20% Providers: Pt's choice SECONDARY: [employer]      Policy#: [id number]      Subscriber: patient CM Name:       Phone#:      Fax#:  Pre-Cert#:       Employer:  Benefits:  Phone #:[phone]      Name:  Eff. Date:      Deduct:       Out of Pocket Max:       Life Max:  CIR:       SNF:  Outpatient:      Co-Pay:  Home Health:       Co-Pay:  DME:     Co-Pay:   Medicaid Application Date:       Case Manager:  Disability Application Date:       Case Worker:   The "Data Collection Information Summary" for patients in Inpatient Rehabilitation Facilities with attached "Privacy Act [place] Records" was provided and verbally reviewed with: Patient  Emergency Contact Information Contact Information    Name Relation Home Work [address]   Willie Cisneros [place] Son [phone]  [phone]      Current Medical History  Patient Admitting Diagnosis:RLE weakness complicated by multiple medical issues.   History of Present Illness: Willie Cisneros is a 86 year old male history of TIA as well as shingles on no prescription medications.  Per chart review lives with spouse  independent prior to admission.  1 level home.  He also has a son in the area who can assist.  Presented 04/03/2019 with increasing shortness of breath and chest discomfort.  He recently had sustained a left foot strain for which she had a boot in place he tripped on the boot and fell March 7 without loss of consciousness.  He did strike the right side of his head and face.  Admission chemistries unremarkable, WBC 12,000, troponin high-sensitivity 369.  Cranial CT/MRI scan showed no evidence of intracranial abnormality.  CT cervical spine negative for fracture or dislocation however there was a nondisplaced left zygomatic arch fracture.  CT angiogram of the chest positive for pulmonary embolus with findings worrisome for right heart strain.  Peripheral wedge-shaped opacity in left lower lobe worrisome for pulmonary infarct.  MRI of the right knee showed focal radial tear at the posterior horn body  junction of the medial meniscus.  Probable nondisplaced horizontal tear component involving the posterior horn.  Popliteus tendon injury and muscle strain with moderate joint effusion.  Orthopedic services Dr. Ihor Gully follow-up with conservative care weightbearing as tolerated.  Cardiology service follow-up for evaluation of possible atrial fibrillation with RVR an echocardiogram ejection fraction 45%.  His elevated troponin felt to be related to demand ischemia.  Patient was started on intravenous heparin transition to Eliquis for both pulmonary emboli as well as atrial fibrillation.  He is tolerating a regular diet.  Therapy evaluations completed and patient is to be admitted for a comprehensive rehab program on 04/09/19.    Patient's medical record from [facility] has been reviewed by the rehabilitation admission coordinator and physician.  Past Medical History  Past Medical History:  Diagnosis Date  . Glaucoma   . Kidney stones   . Medical history non-contributory   . Shingles   . TIA (transient  ischemic attack)     Family History   family history is not on file.  Prior Rehab/Hospitalizations Has the patient had prior rehab or hospitalizations prior to admission? No  Has the patient had major surgery during 100 days prior to admission? No   Current Medications  Current Facility-Administered Medications:  .  apixaban (ELIQUIS) tablet 10 mg, 10 mg, Oral, BID, 10 mg at 04/09/19 0845 **FOLLOWED BY** [START ON 04/14/2019] apixaban (ELIQUIS) tablet 5 mg, 5 mg, Oral, BID, Pham, Anh P, RPH .  latanoprost (XALATAN) 0.005 % ophthalmic solution 1 drop, 1 drop, Both Eyes, QHS, Olalere, Adewale A, MD, 1 drop at 04/08/19 2014 .  MEDLINE mouth rinse, 15 mL, Mouth Rinse, BID, Olalere, Adewale A, MD, 15 mL at 04/09/19 0846 .  metoprolol tartrate (LOPRESSOR) injection 2.5 mg, 2.5 mg, Intravenous, Q6H PRN, Olalere, Adewale A, MD, 2.5 mg at 04/05/19 2231 .  metoprolol tartrate (LOPRESSOR) tablet 100 mg, 100 mg, Oral, BID, Kroeger, Krista M., PA-C, 100 mg at 04/09/19 8250  Patients Current Diet:  Diet Order            Diet regular Room service appropriate? Yes; Fluid consistency: Thin  Diet effective now              Precautions / Restrictions Precautions Precautions: Fall Precaution Comments: Monitor for any shortness of breath Restrictions Weight Bearing Restrictions: No   Has the patient had 2 or more falls or a fall with injury in the past year? Yes  Prior Activity Level Community (5-7x/wk): very active PTA, up until covid in 03/1998, pt would play raquetball 3days/week, walked a track for 2 miles, and did a 14 station weight rotation. After covid hit, and his gym closed, pt would still walk 1.5 miles on MWF; no AD use. 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   Prior Functional Level Self Care: Did the patient need help bathing, dressing, using the toilet or eating? Independent  Indoor Mobility: Did the patient  need assistance with walking from room to room (with or without device)? Independent  Stairs: Did the patient need assistance with internal or external stairs (with or without device)? Independent  Functional Cognition: Did the patient need help planning regular tasks such as shopping or remembering to take medications? Independent  Home Assistive Devices / Equipment Home Assistive Devices/Equipment: None Home Equipment: Cane - single point  Prior Device Use: Indicate devices/aids used by the patient prior to current illness, exacerbation or injury? None  of the above  Current Functional Level Cognition  Overall Cognitive Status: Within Functional Limits for tasks assessed Orientation Level: Oriented X4 General Comments: He is very motivated and would benefit from [facility] and he is in process of arranging others to care for wife and allow him to go to [facility]. Spoke with CW- and they are coordinating with son    Extremity Assessment (includes Sensation/Coordination)  Upper Extremity Assessment: RUE deficits/detail, LUE deficits/detail RUE: (Grossly 4-/5, AROM WFL) LUE: (Grossly 4-/5, AROM WFL)  Lower Extremity Assessment: Defer to PT evaluation    ADLs  Overall ADL's : Needs assistance/impaired Grooming: Wash/dry hands, Wash/dry face, Set up, Sitting Upper Body Bathing: Set up Lower Body Bathing: Maximal assistance Upper Body Dressing : Set up Lower Body Dressing: Total assistance Toilet Transfer: +2 for physical assistance, +2 for safety/equipment, Maximal assistance Toileting- Clothing Manipulation and Hygiene: Total assistance Tub/ Shower Transfer: Maximal assistance, +2 for physical assistance, +2 for safety/equipment Functional mobility during ADLs: Moderate assistance, +2 for safety/equipment, +2 for physical assistance    Mobility  Overal bed mobility: Needs Assistance Bed Mobility: Rolling, Sidelying to Sit, Supine to Sit, Sit to Supine, Sit to Sidelying Rolling: Mod  assist, Min assist, +2 for physical assistance Sidelying to sit: Mod assist, Min assist, +2 for physical assistance Supine to sit: Mod assist, Min assist, +2 for physical assistance Sit to supine: Mod assist Sit to sidelying: Mod assist, Min assist, +2 for physical assistance    Transfers  Overall transfer level: Needs assistance Equipment used: Rolling walker (2 wheeled) Transfers: Sit to/from Stand Sit to Stand: Mod assist, +2 physical assistance, From elevated surface General transfer comment: Recommend assist of 2 for safety and advancing in distance. Discussed mobility and safety with RN- left patient in BS chair to encourage OOB time    Ambulation / Gait / Stairs / Wheelchair Mobility  Ambulation/Gait Ambulation/Gait assistance: Mod assist, Min assist, +2 physical assistance Gait Distance (Feet): 14 Feet Assistive device: Rolling walker (2 wheeled) Gait Pattern/deviations: Shuffle, Wide base of support Gait velocity interpretation: 1.31 - 2.62 ft/sec, indicative of limited community ambulator    Posture / Balance Balance Overall balance assessment: Needs assistance Sitting balance-Leahy Scale: Good Standing balance support: Single extremity supported Standing balance-Leahy Scale: Fair    Special needs/care consideration BiPAP/CPAP: no CPM : no Continuous Drip IV : no Dialysis : no        Days : no Life Vest : no Oxygen : on RA Special Bed : no Trach Size : no Wound Vac (area) : no      Location : no Skin : abrasion to right knee, ecchymosis to right face                   Bowel mgmt: last BM 04/09/19, continent Bladder mgmt: continent Diabetic mgmt: no Behavioral consideration : no Chemo/radiation : no   Previous Home Environment (from acute therapy documentation) Living Arrangements: Spouse/significant other Available Help at Discharge: Family Type of Home: House Home Layout: One level Home Access: Stairs to enter Entrance Stairs-Rails: Can reach both, Left,  Right Entrance Stairs-Number of Steps: 2 from garage with no handrails, but "grab bars- 2" Bathroom Shower/Tub: Multimedia programmer: Handicapped height Bathroom Accessibility: Yes How Accessible: Accessible via walker Home Care Services: No  Discharge Living Setting Plans for Discharge Living Setting: House, Lives with (comment)(lives with wife) Type of Home at Discharge: House Discharge Home Layout: One level Discharge Home Access: Stairs to enter Entrance Stairs-Rails: Can reach  both Entrance Stairs-Number of Steps: 2 Discharge Bathroom Shower/Tub: Walk-in shower Discharge Bathroom Toilet: Handicapped height Discharge Bathroom Accessibility: Yes How Accessible: Accessible via walker Does the patient have any problems obtaining your medications?: No  Social/Family/Support Systems Patient Roles: Spouse, Caregiver Contact Information: son: Brown "Rick" Anticipated Caregiver: Delfino Lovett and neighbor Anticipated Ambulance person Information: [place]: [phone] Ability/Limitations of Caregiver: intermittant supervisoin  Caregiver Availability: Intermittent Discharge Plan Discussed with Primary Caregiver: Yes(pt and his son) Is Caregiver In Agreement with Plan?: Yes Does Caregiver/Family have Issues with Lodging/Transportation while Pt is in Rehab?: No  Goals/Additional Needs Patient/Family Goal for Rehab: PT/OT: Mod I/Supervision; SLP: NA Expected length of stay: 10-14 days Cultural Considerations: NA Dietary Needs: regular diet, thin liquids Equipment Needs: TBD Pt/Family Agrees to Admission and willing to participate: Yes Program Orientation Provided & Reviewed with Pt/Caregiver Including Roles  & Responsibilities: Yes(pt and his son)  Barriers to Discharge: Home environment access/layout, Lack of/limited family support  Barriers to Discharge Comments: steps to enter home; intermittant supervision only.   Decrease burden of Care through IP rehab admission:  NA  Possible need for SNF placement upon discharge: Not anticipated; pt has an excellent prognosis for further progress through CIR. Pt was an active, Independent caregiver for his wife prior to hospital admission. Pt is very motivated for a return to [place] and a return to his wife at home. Feel with a CIR stay, pt will be able to reach Mod I/itermittant supervision goals for a safe DC home.   Patient Condition: I have reviewed medical records from [facility], spoken with MD, RN, and patient and son. I discussed via phone for inpatient rehabilitation assessment.  Patient will benefit from ongoing PT and OT, can actively participate in 3 hours of therapy a day 5 days of the week, and can make measurable gains during the admission.  Patient will also benefit from the coordinated team approach during an Inpatient Acute Rehabilitation admission.  The patient will receive intensive therapy as well as Rehabilitation physician, nursing, social worker, and care management interventions.  Due to safety, skin/wound care, disease management, medication administration, pain management and patient education the patient requires 24 hour a day rehabilitation nursing.  The patient is currently Mod A;MIn A + 2 for 14 feet with mobility; ADLs not yet assessed but anticipate ADL needs due to generalized weakness.  Discharge setting and therapy post discharge at home with home health is anticipated.  Patient has agreed to participate in the Acute Inpatient Rehabilitation Program and will admit 04/09/19.  Preadmission Screen Completed By:  [name], 04/09/2019 4:58 PM ______________________________________________________________________   Discussed status with Dr. Ranell Patrick on 04/09/19 at 10:30AM and received approval for admission today.  Admission Coordinator:  [name], MD, time 10:30AM/Date 3/191/21   Assessment/Plan: Diagnosis: Right knee focal radial rear at posterior horn body junction  of medial meniscus 1. Does the need for close, 24 hr/day Medical supervision in concert with the patient's rehab needs make it unreasonable for this patient to be served in a less intensive setting? Yes 2. Co-Morbidities requiring supervision/potential complications: history of TIA and shingles, pulmonary embolism, popliteus tendon injury, moderate knee joint effusion 3. Due to bladder management, bowel management, safety, skin/wound care, disease management, medication administration, pain management and patient education, does the patient require 24 hr/day rehab nursing? Yes 4. Does the patient require coordinated care of a physician, rehab nurse, PT, OT to address physical and functional deficits in the context of the above medical diagnosis(es)? Yes  Addressing deficits in the following areas: balance, endurance, locomotion, strength, transferring, bowel/bladder control, bathing, dressing, feeding, grooming, toileting and psychosocial support 5. Can the patient actively participate in an intensive therapy program of at least 3 hrs of therapy 5 days a week? Yes 6. The potential for patient to make measurable gains while on inpatient rehab is excellent 7. Anticipated functional outcomes upon discharge from inpatient rehab: modified independent PT, modified independent OT, independent SLP 8. Estimated rehab length of stay to reach the above functional goals is: 7-10 days 9. Anticipated discharge destination: Home 10. Overall Rehab/Functional Prognosis: excellent   MD Signature: Leeroy Cha, MD

## 2019-04-08 NOTE — Progress Notes (Addendum)
Progress Note  Patient Name: Willie Cisneros Date of Encounter: 04/08/2019  Primary Cardiologist: Larae Grooms, MD   Subjective   Patient is anxious to go home. Unaware of his Afib. No chest pain or SOB.   Inpatient Medications    Scheduled Meds: . apixaban  10 mg Oral BID   Followed by  . [START ON 04/14/2019] apixaban  5 mg Oral BID  . latanoprost  1 drop Both Eyes QHS  . mouth rinse  15 mL Mouth Rinse BID  . metoprolol tartrate  100 mg Oral BID   Continuous Infusions:  PRN Meds: metoprolol tartrate   Vital Signs    Vitals:   04/07/19 2108 04/08/19 0144 04/08/19 0500 04/08/19 0623  BP: 130/79 126/77  111/76  Pulse: (!) 101 82  94  Resp: 16 18    Temp: 98.3 F (36.8 C) 98.8 F (37.1 C)  98.1 F (36.7 C)  TempSrc: Oral Oral  Oral  SpO2: 95% 96%  91%  Weight:   81 kg   Height:        Intake/Output Summary (Last 24 hours) at 04/08/2019 0900 Last data filed at 04/08/2019 0654 Gross per 24 hour  Intake 200 ml  Output 1150 ml  Net -950 ml   Filed Weights   04/06/19 0540 04/07/19 0500 04/08/19 0500  Weight: 81.6 kg 82.2 kg 81 kg    Telemetry    Atrial fibrillation with rates in the 80s-100s, occasional PVC - Personally Reviewed  ECG    No new tracings - Personally Reviewed  Physical Exam   GEN: laying in bed in no acute distress.   Neck: No JVD, no carotid bruits Cardiac: IRIR, no murmurs, rubs, or gallops.  Respiratory: Clear to auscultation bilaterally, no wheezes/ rales/ rhonchi GI: NABS, Soft, nontender, non-distended  MS: trace edema; No deformity. Neuro:  Nonfocal, moving all extremities spontaneously Psych: Normal affect   Labs    Chemistry Recent Labs  Lab 04/03/19 0917 04/04/19 0346 04/06/19 0619  NA 138 138 135  K 4.6 4.1 4.2  CL 104 102 99  CO2 24 25 23   GLUCOSE 146* 131* 134*  BUN 17 18 16   CREATININE 1.00 0.96 0.93  CALCIUM 9.0 8.6* 8.4*  GFRNONAA >60 >60 >60  GFRAA >60 >60 >60  ANIONGAP 10 11 13       Hematology Recent Labs  Lab 04/06/19 0619 04/07/19 0452 04/08/19 0513  WBC 9.9 8.3 8.6  RBC 3.94* 4.02* 4.19*  HGB 12.1* 12.2* 12.7*  HCT 37.2* 37.3* 38.8*  MCV 94.4 92.8 92.6  MCH 30.7 30.3 30.3  MCHC 32.5 32.7 32.7  RDW 13.2 13.1 13.2  PLT 136* 132* 144*    Cardiac EnzymesNo results for input(s): TROPONINI in the last 168 hours. No results for input(s): TROPIPOC in the last 168 hours.   BNP Recent Labs  Lab 04/03/19 0926  BNP 125.9*     DDimer No results for input(s): DDIMER in the last 168 hours.   Radiology    MR KNEE RIGHT WO CONTRAST  Result Date: 04/06/2019 CLINICAL DATA:  Right knee pain after fall 1 week ago EXAM: MRI OF THE RIGHT KNEE WITHOUT CONTRAST TECHNIQUE: Multiplanar, multisequence MR imaging of the knee was performed. No intravenous contrast was administered. COMPARISON:  X-ray 04/03/2019. Right femur MRI 04/05/2019 FINDINGS: MENISCI Medial meniscus: Focal radial tear at the posterior horn-body junction (series 5, image 22; series 10, image 6). Possible nondisplaced horizontal tear of the posterior horn (series 10, image 7). Mild  intrasubstance degeneration of the posterior horn and body. Lateral meniscus:  Intact. LIGAMENTS Cruciates:  Intact ACL and PCL. Collaterals: Medial collateral ligament is intact. Lateral collateral ligament complex is intact. CARTILAGE Patellofemoral: Trochlear chondral thinning without focal defect. Patellar cartilage intact. Medial: Mild partial-thickness cartilage loss of the medial femorotibial compartment. Lateral:  No chondral defect. Joint:  Moderate joint effusion. Unremarkable fat pads. Popliteal Fossa: Distal popliteus tendon is thickened and heterogeneous (series 8, image 10) with intramuscular edema within the popliteus muscle belly. No Baker's cyst. Extensor Mechanism: Intact quadriceps tendon and patellar tendon. Mild distal patellar tendinosis. Bones: No focal marrow signal abnormality. No fracture or dislocation. Other:  Mild prepatellar soft tissue edema. Small ganglion posteromedial to the fibular head. IMPRESSION: 1. Focal radial tear at the posterior horn-body junction of the medial meniscus. Probable nondisplaced horizontal tear component involving the posterior horn. 2. Popliteus tendon injury and muscle strain. 3. Moderate joint effusion. Electronically Signed   By: Duanne Guess D.O.   On: 04/06/2019 10:51    Cardiac Studies   Echocardiogram 04/03/2019: 1. Left ventricular ejection fraction, by estimation, is 40 to 45% with  beat to beat variability. The left ventricle has mildly decreased  function. Left ventricular endocardial border not optimally defined to  evaluate regional wall motion. There is  moderate left ventricular hypertrophy.  2. Right ventricular systolic function is mildly reduced. The right  ventricular size is normal. Tricuspid regurgitation signal is inadequate  for assessing PA pressure.  3. Left atrial size was mildly dilated.  4. The mitral valve is normal in structure. Trivial mitral valve  regurgitation. No evidence of mitral stenosis.  5. The aortic valve is abnormal. Aortic valve regurgitation is not  visualized. No aortic stenosis is present.  6. Aortic dilatation noted. There is borderline dilatation of the  ascending aorta measuring 39 mm.  7. The inferior vena cava is normal in size with greater than 50%  respiratory variability, suggesting right atrial pressure of 3 mmHg.   Patient Profile     86 year old male with past medical history of nephrolithiasis for evaluation of recent pulmonary embolus, question cardiomyopathy and atrial fibrillation. Pt recently had boot placed secondary to stress fracture in his foot. He has been somewhat inactive for 4 weeks. He then developed pain in his chest described as a sharp pain that increased with inspiration. Also with dyspnea. He was admitted and found to have a pulmonary embolus. Also had atrial fibrillation with  rapid ventricular response. Echocardiogram interpreted as ejection fraction 40 to 45%.  I reviewed and felt likely 50 to 55%.Chest CT shows pulmonary embolus. Troponin 450.   Assessment & Plan    1. New onset atrial fibrillation: found to be in Afib RVR on admission. Rates fairly well controlled though still in the 100s at times. Possible this is 2/2 PE diagnosed this admission, likely a result of immobilization from a recent stress fracture in his foot. He remains asymptomatic with plans to focus on rate control at this time and consider DCCV after at least 3 weeks of uninterrupted anticoagulation.  - Will increase metoprolol to 100mg  BID - Continue eliquis for stroke ppx  2. ?Cardiomyopathy: echo noted to have EF 40-45%, though on Dr. review, felt to be more likely 50-55%. He appears euvolemic - No further cardiac work-up at this time  3. Elevated troponin: likely demand ischemia 2/2 PE. He has no anginal complaints - No further cardiac work-up at this time.   4. PE: patient presented with inspirational  chest pain after increased sedentary state due to recent stress fracture in his foot. - Continue therapeutic eliquis dosing  Will arrange follow-up with Dr. Edgar Frisk for within 2 weeks for [facility] visit.   For questions or updates, please contact [facility] Please consult [email or web address] for contact info under Cardiology/STEMI.      Signed, Beatriz Stallion, PA-C  04/08/2019, 9:00 AM   [phone] As above, patient seen and examined.  Patient denies dyspnea or chest pain.  No knee pain.  His heart rate is upper normal to mildly elevated.  Increase metoprolol to 100 mg twice daily and follow.  Agree with apixaban initially at pulmonary embolus dose and then 5 mg twice daily thereafter.  Patient can be discharged from a cardiac standpoint on present medications.  Follow-up APP 2 weeks.  Follow-up Dr. Eldridge Dace 3 months.  Atrial fibrillation may have been related to recent  pulmonary embolus.  If it persists at follow-up would favor attempt at cardioversion to see if he would hold sinus rhythm.  Would repeat echocardiogram after sinus has been reestablished to reassess LV function.  Cardiology will sign off.  Please call with questions.  Olga Millers, MD

## 2019-04-08 NOTE — Progress Notes (Signed)
Physical Therapy Treatment Patient Details Name: Willie Cisneros MRN: [id number] DOB: 1926/12/11 Today's Date: 04/08/2019    History of Present Illness 86 year old male patient admitted s/p fall with injury to Left foot strain, Right knee and facial brusing. He has glaucoma and had increasing cough at time of admission. Mild shortness of breath with min-mod exertion.    PT Comments    Patient resting in bed when PT and tech arrived . He was in agreement to participate and quickly shared that he was being considered for [facility]- and remains hopeful that he can be selected and go soon. CW indicated they are coordinating efforts with the son. He requires assist of 2 for all bed mobility, transfers, and gait- but is improving. Did wear his ORTHO boot on left foot today. Cues for postural awareness. Can partially correct- but cannot stand fully erect due to kyphosis. Practiced LE/UE ex format per his printed HEP, good return demo- 20 reps each (2 sets of 10, resting in between). Reminded to continue to practice these in between PT sessions. Should continue to benefit from PT, and remains hopeful regarding attending rehab. At end of session, he was in [facility] chair, remote/call light in reach along with telephone. Cover over lap and BS table in front of him. BS chair alarm activated and he was reminded not to get up by himself, but to alert nursing with his call button.  Follow Up Recommendations  CIR     Equipment Recommendations  Rolling walker with 5" wheels    Recommendations for Other Services       Precautions / Restrictions Precautions Precautions: Fall Precaution Comments: Monitor for any shortness of breath Restrictions Weight Bearing Restrictions: No    Mobility  Bed Mobility Overal bed mobility: Needs Assistance Bed Mobility: Rolling;Sidelying to Sit;Supine to Sit;Sit to Supine;Sit to Sidelying Rolling: Mod assist;Min assist;+2 for physical assistance Sidelying to sit: Mod assist;Min  assist;+2 for physical assistance Supine to sit: Mod assist;Min assist;+2 for physical assistance   Sit to sidelying: Mod assist;Min assist;+2 for physical assistance    Transfers Overall transfer level: Needs assistance Equipment used: Rolling walker (2 wheeled) Transfers: Sit to/from Stand Sit to Stand: Mod assist;Min assist;+2 physical assistance         General transfer comment: Recommend assist of 2 for safety and advancing in distance. Discussed mobility and safety with RN- left patient in BS chair to encourage OOB time  Ambulation/Gait Ambulation/Gait assistance: Mod assist;Min assist;+2 physical assistance Gait Distance (Feet): 14 Feet Assistive device: Rolling walker (2 wheeled) Gait Pattern/deviations: Shuffle;Wide base of support   Gait velocity interpretation: 1.31 - 2.62 ft/sec, indicative of limited community Optometrist    Modified Rankin (Stroke Patients Only)       Balance Overall balance assessment: Needs assistance   Sitting balance-Leahy Scale: Good     Standing balance support: Single extremity supported Standing balance-Leahy Scale: Fair                              Cognition Arousal/Alertness: Awake/alert Behavior During Therapy: WFL for tasks assessed/performed Overall Cognitive Status: Within Functional Limits for tasks assessed                                 General Comments: He is  very motivated and would benefit from [facility] and he is in process of arranging others to care for wife and allow him to go to [facility]. Spoke with CW- and they are coordinating with son      Exercises General Exercises - Lower Extremity Ankle Circles/Pumps: AROM;20 reps Quad Sets: AROM;20 reps Gluteal Sets: AROM;20 reps Short Arc Quad: AROM;20 reps Long Arc Quad: 20 reps Heel Slides: AROM;20 reps Hip ABduction/ADduction: AROM;20 reps Hip Flexion/Marching: AROM;Seated;20 reps Other  Exercises Other Exercises: Printed sheet of ex was issued to patient with full instructions and return demo    General Comments General comments (skin integrity, edema, etc.): Monitor closely for postural awareness as he tends to FF trunk with slight posterior lean- but can correct to improved level (not fully erect due to kyphosis)      Pertinent Vitals/Pain Pain Assessment: No/denies pain    Home Living                      Prior Function            PT Goals (current goals can now be found in the care plan section) Acute Rehab PT Goals Patient Stated Goal: Just to get well- I Know I could do well in [facility]- hope I can go PT Goal Formulation: With patient Time For Goal Achievement: 04/21/19 Potential to Achieve Goals: Good Progress towards PT goals: Progressing toward goals    Frequency    Min 3X/week      PT Plan      Co-evaluation              AM-PAC PT "6 Clicks" Mobility   Outcome Measure  Help needed turning from your back to your side while in a flat bed without using bedrails?: A Little Help needed moving from lying on your back to sitting on the side of a flat bed without using bedrails?: A Little Help needed moving to and from a bed to a chair (including a wheelchair)?: A Lot Help needed standing up from a chair using your arms (e.g., wheelchair or bedside chair)?: A Lot Help needed to walk in [facility] room?: A Lot Help needed climbing 3-5 steps with a railing? : A Lot 6 Click Score: 14    End of Session Equipment Utilized During Treatment: Gait belt Activity Tolerance: Patient tolerated treatment well Patient left: in chair;with call bell/phone within reach;with chair alarm set Nurse Communication: Mobility status PT Visit Diagnosis: Unsteadiness on feet (R26.81);Other abnormalities of gait and mobility (R26.89);Muscle weakness (generalized) (M62.81)     Time: 9323-5573 PT Time Calculation (min) (ACUTE ONLY): 32 min  Charges:  $Gait  Training: 8-22 mins $Therapeutic Exercise: 8-22 mins                    [name], PT # [phone] CGV cell  Casandra Doffing 04/08/2019, 4:36 PM

## 2019-04-08 NOTE — Telephone Encounter (Signed)
Pt still in the hospital... may be going to inpatient rehab... will continue to follow for TOC.

## 2019-04-09 ENCOUNTER — Inpatient Hospital Stay (HOSPITAL_COMMUNITY)
Admission: RE | Admit: 2019-04-09 | Discharge: 2019-04-16 | DRG: 945 | Disposition: A | Discharge status: Home Health | Payer: Medicare Other | Source: Intra-hospital | Attending: Physical Medicine & Rehabilitation | Admitting: Physical Medicine & Rehabilitation

## 2019-04-09 DIAGNOSIS — Z882 Allergy status to sulfonamides status: Secondary | ICD-10-CM | POA: Diagnosis not present

## 2019-04-09 DIAGNOSIS — I248 Other forms of acute ischemic heart disease: Secondary | ICD-10-CM | POA: Diagnosis present

## 2019-04-09 DIAGNOSIS — Z885 Allergy status to narcotic agent status: Secondary | ICD-10-CM

## 2019-04-09 DIAGNOSIS — H409 Unspecified glaucoma: Secondary | ICD-10-CM | POA: Diagnosis present

## 2019-04-09 DIAGNOSIS — W010XXD Fall on same level from slipping, tripping and stumbling without subsequent striking against object, subsequent encounter: Secondary | ICD-10-CM | POA: Diagnosis present

## 2019-04-09 DIAGNOSIS — Z87442 Personal history of urinary calculi: Secondary | ICD-10-CM | POA: Diagnosis not present

## 2019-04-09 DIAGNOSIS — I4891 Unspecified atrial fibrillation: Secondary | ICD-10-CM | POA: Diagnosis present

## 2019-04-09 DIAGNOSIS — S96912D Strain of unspecified muscle and tendon at ankle and foot level, left foot, subsequent encounter: Secondary | ICD-10-CM

## 2019-04-09 DIAGNOSIS — T1490XA Injury, unspecified, initial encounter: Secondary | ICD-10-CM | POA: Diagnosis not present

## 2019-04-09 DIAGNOSIS — Z91041 Radiographic dye allergy status: Secondary | ICD-10-CM

## 2019-04-09 DIAGNOSIS — G4700 Insomnia, unspecified: Secondary | ICD-10-CM | POA: Diagnosis present

## 2019-04-09 DIAGNOSIS — Z7901 Long term (current) use of anticoagulants: Secondary | ICD-10-CM | POA: Diagnosis not present

## 2019-04-09 DIAGNOSIS — Z881 Allergy status to other antibiotic agents status: Secondary | ICD-10-CM | POA: Diagnosis not present

## 2019-04-09 DIAGNOSIS — Z6824 Body mass index (BMI) 24.0-24.9, adult: Secondary | ICD-10-CM | POA: Diagnosis not present

## 2019-04-09 DIAGNOSIS — I2782 Chronic pulmonary embolism: Secondary | ICD-10-CM

## 2019-04-09 DIAGNOSIS — I480 Paroxysmal atrial fibrillation: Secondary | ICD-10-CM

## 2019-04-09 DIAGNOSIS — R5381 Other malaise: Secondary | ICD-10-CM | POA: Diagnosis present

## 2019-04-09 DIAGNOSIS — Z880 Allergy status to penicillin: Secondary | ICD-10-CM | POA: Diagnosis not present

## 2019-04-09 DIAGNOSIS — I2699 Other pulmonary embolism without acute cor pulmonale: Secondary | ICD-10-CM | POA: Diagnosis present

## 2019-04-09 DIAGNOSIS — Z79899 Other long term (current) drug therapy: Secondary | ICD-10-CM

## 2019-04-09 DIAGNOSIS — S86891D Other injury of other muscle(s) and tendon(s) at lower leg level, right leg, subsequent encounter: Secondary | ICD-10-CM | POA: Diagnosis not present

## 2019-04-09 DIAGNOSIS — E440 Moderate protein-calorie malnutrition: Secondary | ICD-10-CM | POA: Diagnosis present

## 2019-04-09 DIAGNOSIS — K5900 Constipation, unspecified: Secondary | ICD-10-CM | POA: Diagnosis present

## 2019-04-09 DIAGNOSIS — M25461 Effusion, right knee: Secondary | ICD-10-CM | POA: Diagnosis present

## 2019-04-09 DIAGNOSIS — S0240FD Zygomatic fracture, left side, subsequent encounter for fracture with routine healing: Secondary | ICD-10-CM | POA: Diagnosis not present

## 2019-04-09 DIAGNOSIS — S83241D Other tear of medial meniscus, current injury, right knee, subsequent encounter: Secondary | ICD-10-CM

## 2019-04-09 DIAGNOSIS — D62 Acute posthemorrhagic anemia: Secondary | ICD-10-CM | POA: Diagnosis present

## 2019-04-09 DIAGNOSIS — E46 Unspecified protein-calorie malnutrition: Secondary | ICD-10-CM

## 2019-04-09 DIAGNOSIS — I2601 Septic pulmonary embolism with acute cor pulmonale: Secondary | ICD-10-CM

## 2019-04-09 DIAGNOSIS — Z8673 Personal history of transient ischemic attack (TIA), and cerebral infarction without residual deficits: Secondary | ICD-10-CM

## 2019-04-09 MED ORDER — METOPROLOL TARTRATE 100 MG PO TABS: 100.0000 mg | ORAL_TABLET | Freq: Two times a day (BID) | ORAL | Status: DC

## 2019-04-09 MED ORDER — LATANOPROST 0.005 % OP SOLN
1.0000 [drp] | Freq: Every day | OPHTHALMIC | Status: DC
  Administered 2019-04-09 – 2019-04-15 (×7): 1 [drp] via OPHTHALMIC
  Filled 2019-04-09: qty 2.5

## 2019-04-09 MED ORDER — ACETAMINOPHEN 325 MG PO TABS: 650.0000 mg | ORAL_TABLET | Freq: Four times a day (QID) | ORAL | Status: DC | PRN

## 2019-04-09 MED ORDER — APIXABAN 5 MG PO TABS: ORAL_TABLET | ORAL | Status: DC

## 2019-04-09 MED ORDER — APIXABAN 5 MG PO TABS
5.0000 mg | ORAL_TABLET | Freq: Two times a day (BID) | ORAL | Status: DC
  Administered 2019-04-14 – 2019-04-16 (×5): 5 mg via ORAL
  Filled 2019-04-09 (×5): qty 1

## 2019-04-09 MED ORDER — APIXABAN 5 MG PO TABS
10.0000 mg | ORAL_TABLET | Freq: Two times a day (BID) | ORAL | Status: AC
  Administered 2019-04-09 – 2019-04-13 (×9): 10 mg via ORAL
  Filled 2019-04-09 (×9): qty 2

## 2019-04-09 MED ORDER — METOPROLOL TARTRATE 50 MG PO TABS
100.0000 mg | ORAL_TABLET | Freq: Two times a day (BID) | ORAL | Status: DC
  Administered 2019-04-09 – 2019-04-16 (×14): 100 mg via ORAL
  Filled 2019-04-09 (×14): qty 2

## 2019-04-09 MED ORDER — SORBITOL 70 % SOLN: 30.0000 mL | Freq: Every day | Status: DC | PRN

## 2019-04-09 NOTE — Discharge Instructions (Signed)
Willie Cisneros,  You were in the hospital because of a blood clot in your lung (Pulmonary Embolism) likely from your right leg blood clot. You are on a blood thinner. Please continue as prescribed. You were also found to have atrial fibrillation. You have been started on a heart rate medication to lower your heart rate. Please follow-up with the cardiologist as an outpatient.   Information on my medicine - ELIQUIS (apixaban)  This medication education was reviewed with me or my healthcare representative as part of my discharge preparation.   Why was Eliquis prescribed for you? Eliquis was prescribed to treat blood clots in your lungs (pulmonary embolism) and to reduce the risk of them occurring again. You also have irregular heart rate and this can put you at risk for stroke.  What do You need to know about Eliquis ? The starting dose is 10 mg (two 5 mg tablets) taken TWICE daily for the FIRST SEVEN (7) DAYS, then on 04/14/2019  the dose is reduced to ONE 5 mg tablet taken TWICE daily.  Eliquis may be taken with or without food.   Try to take the dose about the same time in the morning and in the evening. If you have difficulty swallowing the tablet whole please discuss with your pharmacist how to take the medication safely.  Take Eliquis exactly as prescribed and DO NOT stop taking Eliquis without talking to the doctor who prescribed the medication.  Stopping may increase your risk of developing a new blood clot.  Refill your prescription before you run out.  After discharge, you should have regular check-up appointments with your healthcare provider that is prescribing your Eliquis.    What do you do if you miss a dose? If a dose of ELIQUIS is not taken at the scheduled time, take it as soon as possible on the same day and twice-daily administration should be resumed. The dose should not be doubled to make up for a missed dose.  Important Safety Information A possible side effect  of Eliquis is bleeding. You should call your healthcare provider right away if you experience any of the following: ? Bleeding from an injury or your nose that does not stop. ? Unusual colored urine (red or dark brown) or unusual colored stools (red or black). ? Unusual bruising for unknown reasons. ? A serious fall or if you hit your head (even if there is no bleeding).  Some medicines may interact with Eliquis and might increase your risk of bleeding or clotting while on Eliquis. To help avoid this, consult your healthcare provider or pharmacist prior to using any new prescription or non-prescription medications, including herbals, vitamins, non-steroidal anti-inflammatory drugs (NSAIDs) and supplements.  This website has more information on Eliquis (apixaban): http://www.eliquis.com/eliquis/home

## 2019-04-09 NOTE — Progress Notes (Signed)
Inpatient Rehabilitation-Admissions Coordinator   I have a bed available in IP Rehab today for this patient. I have received medical clearance from Dr. Caleb Popp for admit to CIR today. Spoke to pt over the phone who is on board with admitting to CIR today to semi-private room. RN and TOC tearm aware of plan for admit today. AC will set up transport and will plan for pick up around 4:30 today due to bed availability.   Please call if questions.   Cheri Rous, OTR/L  Rehab Admissions Coordinator  [phone] 04/09/2019 11:01 AM

## 2019-04-09 NOTE — H&P (Addendum)
Physical Medicine and Rehabilitation Admission H&P    Chief Complaint  Patient presents with  . Chest Pain  . Shortness of Breath  : HPI: Willie Cisneros is a 86 year old right-handed male history of TIA as well as shingles on no prescription medications.  Per chart review lives with spouse independent prior to admission.  1 level home.  He also has a son in the area who can assist.  Presented 04/03/2019 with increasing shortness of breath and chest discomfort.  He recently had sustained a left foot strain for which she had a boot in place he tripped on the boot and fell March 7 without loss of consciousness.  He did strike the right side of his head and face.  Admission chemistries unremarkable, WBC 12,000, troponin high-sensitivity 369.  Cranial CT/MRI scan showed no evidence of intracranial abnormality.  CT cervical spine negative for fracture or dislocation however there was a nondisplaced left zygomatic arch fracture.  CT angiogram of the chest positive for pulmonary embolus with findings worrisome for right heart strain.  Peripheral wedge-shaped opacity in left lower lobe worrisome for pulmonary infarct.  MRI of the right knee showed focal radial tear at the posterior horn body junction of the medial meniscus.  Probable nondisplaced horizontal tear component involving the posterior horn.  Popliteus tendon injury and muscle strain with moderate joint effusion.  Orthopedic services Dr. Constance Goltz follow-up with conservative care weightbearing as tolerated.  Cardiology service follow-up for evaluation of possible atrial fibrillation with RVR an echocardiogram ejection fraction 45%.  His elevated troponin felt to be related to demand ischemia.  Patient was started on intravenous heparin transition to Eliquis for both pulmonary emboli as well as atrial fibrillation.  He is tolerating a regular diet.  Therapy evaluations completed and patient was admitted for a comprehensive rehab program.  Review of  Systems  Constitutional: Negative for chills and fever.  HENT: Negative for hearing loss.   Eyes: Negative for blurred vision and double vision.  Respiratory: Positive for shortness of breath. Negative for cough.   Cardiovascular: Positive for chest pain, palpitations and leg swelling.  Gastrointestinal: Positive for constipation. Negative for heartburn, nausea and vomiting.  Genitourinary: Positive for urgency. Negative for dysuria, flank pain and hematuria.  Musculoskeletal: Positive for myalgias.       Patient with recent fall  Skin: Negative for rash.  Psychiatric/Behavioral: The patient has insomnia.   All other systems reviewed and are negative.  Past Medical History:  Diagnosis Date  . Glaucoma   . Kidney stones   . Medical history non-contributory   . Shingles   . TIA (transient ischemic attack)    Past Surgical History:  Procedure Laterality Date  . NO PAST SURGERIES     Family History  Problem Relation Age of Onset  . Heart disease Neg Hx    Social History:  reports that he has never smoked. He has never used smokeless tobacco. He reports previous alcohol use. He reports that he does not use drugs. Allergies:  Allergies  Allergen Reactions  . Penicillins Anaphylaxis    Did it involve swelling of the face/tongue/throat, SOB, or low BP? Yes Did it involve sudden or severe rash/hives, skin peeling, or any reaction on the inside of your mouth or nose? Yes Did you need to seek medical attention at a hospital or doctor's office? Yes When did it last happen?Over 10 years ago If all above answers are "NO", may proceed with cephalosporin use.  . Sulfa Antibiotics  Anaphylaxis and Rash  . Clindamycin Nausea Only  . Contrast Media  [Iodinated Diagnostic Agents] Nausea Only  . Econazole Nitrate Itching and Other (See Comments)    Burning  . Erythromycin Nausea Only  . Oxycodone Nausea And Vomiting   Medications Prior to Admission  Medication Sig Dispense Refill  .  latanoprost (XALATAN) 0.005 % ophthalmic solution Place 1 drop into both eyes at bedtime.     . Multiple Vitamins-Minerals (CENTRUM SILVER 50+MEN) TABS Take 1 tablet by mouth daily.      Drug Regimen Review Drug regimen was reviewed and remains appropriate with no significant issues identified  Home: Home Living Family/patient expects to be discharged to:: Private residence Living Arrangements: Spouse/significant other Available Help at Discharge: Family(states he has a son that is local, but is "very short term when it comes to helping out") Type of Home: House Home Access: Stairs to enter [employer] of Steps: 2 from garage with no handrails, but "grab bars- 2" Entrance Stairs-Rails: Can reach both, Left, Right Home Layout: One level Bathroom Shower/Tub: Multimedia programmer: Handicapped height Bathroom Accessibility: Yes Home Equipment: [place] - single point(He states he has a "walking stick type cane" as well- that the cane is wife gave him was lost between ED and ICU.)   Functional History: Prior Function Level of Independence: Independent  Functional Status:  Mobility: Bed Mobility Overal bed mobility: Needs Assistance Bed Mobility: Rolling, Sidelying to Sit, Supine to Sit, Sit to Supine, Sit to Sidelying Rolling: Mod assist, Min assist, +2 for physical assistance Sidelying to sit: Mod assist, Min assist, +2 for physical assistance Supine to sit: Mod assist, Min assist, +2 for physical assistance Sit to supine: Mod assist Sit to sidelying: Mod assist, Min assist, +2 for physical assistance Transfers Overall transfer level: Needs assistance Equipment used: Rolling walker (2 wheeled) Transfers: Sit to/from Stand Sit to Stand: Mod assist, Min assist, +2 physical assistance General transfer comment: Recommend assist of 2 for safety and advancing in distance. Discussed mobility and safety with RN- left patient in BS chair to encourage OOB  time Ambulation/Gait Ambulation/Gait assistance: Mod assist, Min assist, +2 physical assistance Gait Distance (Feet): 14 Feet Assistive device: Rolling walker (2 wheeled) Gait Pattern/deviations: Shuffle, Wide base of support Gait velocity interpretation: 1.31 - 2.62 ft/sec, indicative of limited community ambulator    ADL:    Cognition: Cognition Overall Cognitive Status: Within Functional Limits for tasks assessed Orientation Level: Oriented X4 Cognition Arousal/Alertness: Awake/alert Behavior During Therapy: WFL for tasks assessed/performed Overall Cognitive Status: Within Functional Limits for tasks assessed General Comments: He is very motivated and would benefit from [facility] and he is in process of arranging others to care for wife and allow him to go to [facility] Spoke with CW- and they are coordinating with son  Physical Exam: Blood pressure 110/80, pulse (!) 106, temperature 98.2 F (36.8 C), temperature source Oral, resp. rate 18, height 5\' 10"  (1.778 m), weight 80.3 kg, SpO2 92 %.   Physical Exam  General: Alert and oriented x 3, No apparent distress HEENT: Head is normocephalic, atraumatic, PERRLA, EOMI, sclera anicteric, oral mucosa pink and moist, dentition intact, ext ear canals clear,  Neck: Supple without JVD or lymphadenopathy Heart: Reg rate and rhythm. No murmurs rubs or gallops Chest: CTA bilaterally without wheezes, rales, or rhonchi; no distress Abdomen: Soft, non-tender, non-distended, bowel sounds positive. Extremities: +RLE edema. Pulses are 2+ Skin: Clean and intact without signs of breakdown Neuro/Musculoskeletal:  : Alert pleasant no acute distress  follows commands oriented x3. Strength is diffusely 4/5 with the exception of RLE which is limited by pain.  Psych: Pt's affect is appropriate. Pt is cooperative  Results for orders placed or performed during the hospital encounter of 04/03/19 (from the past 48 hour(s))  CBC     Status: Abnormal   Collection  Time: 04/08/19  5:13 AM  Result Value Ref Range   WBC 8.6 4.0 - 10.5 K/uL   RBC 4.19 (L) 4.22 - 5.81 MIL/uL   Hemoglobin 12.7 (L) 13.0 - 17.0 g/dL   HCT 82.4 (L) 23.5 - 36.1 %   MCV 92.6 80.0 - 100.0 fL   MCH 30.3 26.0 - 34.0 pg   MCHC 32.7 30.0 - 36.0 g/dL   RDW 44.3 15.4 - 00.8 %   Platelets 144 (L) 150 - 400 K/uL   nRBC 0.0 0.0 - 0.2 %    Comment: Performed at [facility], [address] [place], [place] [address]   No results found.  Medical Problem List and Plan: 1.  Debility with decreased functional mobility secondary to pulmonary emboli  -patient may shower  -ELOS/Goals: modI 7-10 days  -Admit to CIR; WBAT 2.  Antithrombotics: -DVT/anticoagulation:  Eliquis 10mg  BID  -antiplatelet therapy: N/A 3. Pain Management: Tylenol as needed 4. Mood: provide emotional support  -antipsychotic agents: N/A 5. Neuropsych: This patient is capable of making decisions on his own behalf. 6. Skin/Wound Care: Routine skin checks 7. Fluids/Electrolytes/Nutrition: Routine in and outs with follow-up chemistries 8.  Atrial fibrillation.  Continue Eliquis as well as Lopressor 100 mg twice daily.  Cardiac rate controlled.  Follow-up cardiology services 9.  Right knee focal radial tear at the posterior horn body junction of the medial meniscus.  Weightbearing as tolerated and follow-up orthopedic services Dr. 10.  Nondisplaced left zygomatic arch fracture.  Conservative care 11.  Left foot strain from recent fall.  Patient does wear a boot as needed 12. Elevated troponin 2/2 demand ischemia: Monitor for chest pain 13. Disposition: Lives with spouse who has dementia; he is her caretaker. Has a son in the area who can assist.   Charlann Boxer, PA-C 04/09/2019   I have personally performed a face to face diagnostic evaluation, including, but not limited to relevant history and physical exam findings, of this patient and developed relevant assessment and plan.   Additionally, I have reviewed and concur with the physician assistant's documentation above.  04/11/2019, MD

## 2019-04-09 NOTE — Progress Notes (Signed)
Patient arrived to via ambulance A/O x4. Patient was oriented to unit. Patient safety plan and visitors policy have been reviewed. Patient voiced concerns of current status of health. Patient is concerned he will not be able to continue being a caregiver to his wife.PAtient denies pain at this time. Will continue to monitor. Leane Para, LPN

## 2019-04-09 NOTE — Progress Notes (Signed)
Occupational Therapy Evaluation Patient Details Name: Willie Cisneros MRN: [id number] DOB: 1926/10/04 Today's Date: 04/09/2019    History of Present Illness 86 year old male patient admitted s/p fall with injury to Left foot strain, Right knee and facial brusing. He has glaucoma and had increasing cough at time of admission. Mild shortness of breath with min-mod exertion.   Clinical Impression   Pt reports living at home with spouse, who he is primary caregiver for in home. Pt reports performing all self-care tasks and functional mobility with Independence at PLOF. Overall, pt requires setup to total assist for self-care tasks with SOB noted during mobility and dressing tasks. Pt attempted to don underwear while semi-reclined in bed, but required total assist with cues for rolling side to side and bridging to assist with pulling up underwear. Pt was not able to doff grip socks or don shoes while sitting EOB.  Pt required Moderate A x 2 for sit to stand transfer and Min A x 2 with RW for stand pivot transfer to recliner chair with extra time. Pt will benefit from continued skilled acute OT services to reduce level of assist with self-care tasks and functional mobility.     Follow Up Recommendations  CIR    Equipment Recommendations  3 in 1 bedside commode    Recommendations for Other Services       Precautions / Restrictions Precautions Precautions: Fall Required Braces or Orthoses: (L LE ortho shoe) Restrictions Weight Bearing Restrictions: No      Mobility Bed Mobility Overal bed mobility: Needs Assistance Bed Mobility: Rolling;Sidelying to Sit;Supine to Sit;Sit to Supine;Sit to Sidelying Rolling: Mod assist;Min assist;+2 for physical assistance Sidelying to sit: Mod assist;Min assist;+2 for physical assistance Supine to sit: Mod assist;Min assist;+2 for physical assistance        Transfers     Transfers: Sit to/from Stand Sit to Stand: Mod assist;+2 physical  assistance;From elevated surface              Balance Overall balance assessment: Needs assistance   Sitting balance-Leahy Scale: Good       Standing balance-Leahy Scale: Fair                             ADL either performed or assessed with clinical judgement   ADL Overall ADL's : Needs assistance/impaired     Grooming: Wash/dry hands;Wash/dry face;Set up;Sitting   Upper Body Bathing: Set up   Lower Body Bathing: Maximal assistance   Upper Body Dressing : Set up   Lower Body Dressing: Total assistance   Toilet Transfer: +2 for physical assistance;+2 for safety/equipment;Maximal assistance   Toileting- Clothing Manipulation and Hygiene: Total assistance   Tub/ Shower Transfer: Maximal assistance;+2 for physical assistance;+2 for safety/equipment   Functional mobility during ADLs: Moderate assistance;+2 for safety/equipment;+2 for physical assistance       Vision Baseline Vision/History: No visual deficits       Perception     Praxis      Pertinent Vitals/Pain Pain Assessment: 0-10 Pain Score: 2  Pain Location: R LE  Pain Descriptors / Indicators: Dull Pain Intervention(s): Limited activity within patient's tolerance     Hand Dominance Right   Extremity/Trunk Assessment Upper Extremity Assessment Upper Extremity Assessment: RUE deficits/detail;LUE deficits/detail RUE: (Grossly 4-/5, AROM WFL) LUE: (Grossly 4-/5, AROM WFL)   Lower Extremity Assessment Lower Extremity Assessment: Defer to PT evaluation       Communication Communication Communication: No difficulties  Cognition Arousal/Alertness: Awake/alert Behavior During Therapy: WFL for tasks assessed/performed Overall Cognitive Status: Within Functional Limits for tasks assessed                                     General Comments  R LE edema     Exercises     Shoulder Instructions      Home Living Family/patient expects to be discharged to:: Private  residence Living Arrangements: Spouse/significant other Available Help at Discharge: Family Type of Home: House Home Access: Stairs to enter [employer] of Steps: 2 from garage with no handrails, but "grab bars- 2" Entrance Stairs-Rails: Can reach both;Left;Right Home Layout: One level     Bathroom Shower/Tub: Occupational psychologist: Handicapped height Bathroom Accessibility: Yes How Accessible: Accessible via walker Home Equipment: [place] - single point          Prior Functioning/Environment Level of Independence: Independent                 OT Problem List: Decreased strength;Decreased activity tolerance;Impaired balance (sitting and/or standing);Decreased safety awareness;Decreased knowledge of use of DME or AE      OT Treatment/Interventions: Self-care/ADL training;Therapeutic exercise;Energy conservation;DME and/or AE instruction;Therapeutic activities;Patient/family education;Balance training    OT Goals(Current goals can be found in the care plan section) Acute Rehab OT Goals Patient Stated Goal: to get my strength back OT Goal Formulation: With patient Time For Goal Achievement: 04/23/19 Potential to Achieve Goals: Fair ADL Goals Pt Will Perform Grooming: with set-up;standing Pt Will Perform Lower Body Dressing: with adaptive equipment;with mod assist;sit to/from stand Pt Will Transfer to Toilet: bedside commode;with mod assist Pt Will Perform Toileting - Clothing Manipulation and hygiene: with mod assist  OT Frequency: Min 2X/week   Barriers to D/C:            Co-evaluation              AM-PAC OT "6 Clicks" Daily Activity     Outcome Measure Help from another person eating meals?: A Little Help from another person taking care of personal grooming?: A Little Help from another person toileting, which includes using toliet, bedpan, or urinal?: Total Help from another person bathing (including washing, rinsing, drying)?: A  Lot Help from another person to put on and taking off regular upper body clothing?: A Little Help from another person to put on and taking off regular lower body clothing?: Total 6 Click Score: 13   End of Session Equipment Utilized During Treatment: Gait belt;Rolling walker Nurse Communication: Mobility status  Activity Tolerance: Patient tolerated treatment well Patient left: in chair;with call bell/phone within reach;with chair alarm set  OT Visit Diagnosis: Unsteadiness on feet (R26.81);Muscle weakness (generalized) (M62.81)                Time: 9509-3267 OT Time Calculation (min): 34 min Charges:  OT General Charges $OT Visit: 1 Visit OT Evaluation $OT Eval Moderate Complexity: 1 Mod OT Treatments $Self Care/Home Management : 8-22 mins  Kirsten Spearing OTR/L   Willie Cisneros 04/09/2019, 2:42 PM

## 2019-04-09 NOTE — Telephone Encounter (Signed)
The pt is still in the hospital. We will monitor his chart and call once he has been discharged.

## 2019-04-09 NOTE — Progress Notes (Signed)
Report called to CIR. IV and tele removed. Packet of papers at desk. Await PTAR to transport patient to [facility] room 8[facility].

## 2019-04-09 NOTE — Progress Notes (Signed)
ANTICOAGULATION CONSULT NOTE - Follow Up Consult  Pharmacy Consult for heparin--> Eliquis Indication: acute pulmonary embolus; Atrial fibrillation  Allergies  Allergen Reactions  . Penicillins Anaphylaxis    Did it involve swelling of the face/tongue/throat, SOB, or low BP? Yes Did it involve sudden or severe rash/hives, skin peeling, or any reaction on the inside of your mouth or nose? Yes Did you need to seek medical attention at a hospital or doctor's office? Yes When did it last happen?Over 10 years ago If all above answers are "NO", may proceed with cephalosporin use.  . Sulfa Antibiotics Anaphylaxis and Rash  . Clindamycin Nausea Only  . Contrast Media  [Iodinated Diagnostic Agents] Nausea Only  . Econazole Nitrate Itching and Other (See Comments)    Burning  . Erythromycin Nausea Only  . Oxycodone Nausea And Vomiting    Patient Measurements: Height: 5\' 10"  (177.8 cm) Weight: 177 lb 0.5 oz (80.3 kg) IBW/kg (Calculated) : 73 Heparin Dosing Weight: 83 kg  Vital Signs: Temp: 98.2 F (36.8 C) (03/19 0547) Temp Source: Oral (03/19 0547) BP: 110/80 (03/19 0547) Pulse Rate: 106 (03/19 0547)  Labs: Recent Labs    04/06/19 1746 04/07/19 0452 04/08/19 0513  HGB  --  12.2* 12.7*  HCT  --  37.3* 38.8*  PLT  --  132* 144*  HEPARINUNFRC 0.29* 0.45  --     Estimated Creatinine Clearance: 54.5 mL/min (by C-G formula based on SCr of 0.93 mg/dL).   Assessment: Patient's a 86 y.o M presented to the ED on 3/13 with c/o CP and SOB. Chest CTA showed acute PE with concern for right heart strain. He was transitioned from heparin drip to Eliquis on 3/17 for PE and afib.  - s/p right knee aspiration on 3/16  Today, 04/09/2019: - cbc stable - no bleeding documented   Goal of Therapy:  Heparin level 0.3-0.7 units/ml Monitor platelets by anticoagulation protocol: Yes   Plan:  - continue Eliquis 10 mg bid x7 days, then 5 mg bid (to start on 3/24) - monitor for s/s  bleeding  Aasir Daigler P 04/09/2019,7:39 AM

## 2019-04-09 NOTE — Progress Notes (Signed)
[name], MD  Physician  Physical Medicine and Rehabilitation  PMR Pre-admission      Addendum  Date of Service:  04/08/2019  7:32 PM      Related encounter: ED to Hosp-Admission (Discharged) from 04/03/2019 in [facility]        PMR Admission Coordinator Pre-Admission Assessment   Patient: Willie Cisneros is an 86 y.o., male MRN: [id number] DOB: May 27, 1926 Height: 5' 10"  (177.8 cm) Weight: 80.3 kg   Insurance Information HMO:     PPO:      PCP:      IPA:      80/20: yes     OTHER:  PRIMARY: Medicare A and B       Policy#: [id number]      Subscriber: Patient CM Name:       Phone#:      Fax#:  Pre-Cert#:       Employer:  Benefits:  Phone #: online     Name: verified eligibility online via [place] on 04/09/19 Eff. Date: Part A and B effective 03/21/1993     Deduct: $1,484      Out of Pocket Max: NA      Life Max: NA CIR: Covered per Medicare guidelines once yearly deductible has been met      SNF: days 1-20, 100% coverage, days 21-100, 80% coverage Outpatient: 80%     Co-Pay: 20% Home Health: 100%      Co-Pay:  DME: 80%     Co-Pay: 20% Providers: Pt's choice SECONDARY: [employer]      Policy#: [id number]      Subscriber: patient CM Name:       Phone#:      Fax#:  Pre-Cert#:       Employer:  Benefits:  Phone #:[phone]      Name:  Eff. Date:      Deduct:       Out of Pocket Max:       Life Max:  CIR:       SNF:  Outpatient:      Co-Pay:  Home Health:       Co-Pay:  DME:     Co-Pay:    Medicaid Application Date:       Case Manager:  Disability Application Date:       Case Worker:    The "Data Collection Information Summary" for patients in Inpatient Rehabilitation Facilities with attached "Privacy Act [place] Records" was provided and verbally reviewed with: Patient   Emergency Contact Information         Contact Information     Name Relation Home Work [address]    RALIEGH, SCOBIE [place] Son [phone]    [phone]         Current Medical History  Patient Admitting Diagnosis:RLE weakness complicated by multiple medical issues.    History of Present Illness: Willie Cisneros is a 86 year old male history of TIA as well as shingles on no prescription medications.  Per chart review lives with spouse independent prior to admission.  1 level home.  He also has a son in the area who can assist.  Presented 04/03/2019 with increasing shortness of breath and chest discomfort.  He recently had sustained a left foot strain for which she had a boot in place he tripped on the boot and fell March 7 without loss of consciousness.  He did strike the right side of his head and face.  Admission chemistries unremarkable, WBC  12,000, troponin high-sensitivity 369.  Cranial CT/MRI scan showed no evidence of intracranial abnormality.  CT cervical spine negative for fracture or dislocation however there was a nondisplaced left zygomatic arch fracture.  CT angiogram of the chest positive for pulmonary embolus with findings worrisome for right heart strain.  Peripheral wedge-shaped opacity in left lower lobe worrisome for pulmonary infarct.  MRI of the right knee showed focal radial tear at the posterior horn body junction of the medial meniscus.  Probable nondisplaced horizontal tear component involving the posterior horn.  Popliteus tendon injury and muscle strain with moderate joint effusion.  Orthopedic services Dr. Ihor Gully follow-up with conservative care weightbearing as tolerated.  Cardiology service follow-up for evaluation of possible atrial fibrillation with RVR an echocardiogram ejection fraction 45%.  His elevated troponin felt to be related to demand ischemia.  Patient was started on intravenous heparin transition to Eliquis for both pulmonary emboli as well as atrial fibrillation.  He is tolerating a regular diet.  Therapy evaluations completed and patient is to be admitted for a comprehensive rehab program on 04/09/19.    Patient's medical record from [facility] has been reviewed by the rehabilitation admission coordinator and physician.   Past Medical History      Past Medical History:  Diagnosis Date  . Glaucoma    . Kidney stones    . Medical history non-contributory    . Shingles    . TIA (transient ischemic attack)        Family History   family history is not on file.   Prior Rehab/Hospitalizations Has the patient had prior rehab or hospitalizations prior to admission? No   Has the patient had major surgery during 100 days prior to admission? No               Current Medications   Current Facility-Administered Medications:  .  apixaban (ELIQUIS) tablet 10 mg, 10 mg, Oral, BID, 10 mg at 04/09/19 0845 **FOLLOWED BY** [START ON 04/14/2019] apixaban (ELIQUIS) tablet 5 mg, 5 mg, Oral, BID, Pham, Anh P, RPH .  latanoprost (XALATAN) 0.005 % ophthalmic solution 1 drop, 1 drop, Both Eyes, QHS, Olalere, Adewale A, MD, 1 drop at 04/08/19 2014 .  MEDLINE mouth rinse, 15 mL, Mouth Rinse, BID, Olalere, Adewale A, MD, 15 mL at 04/09/19 0846 .  metoprolol tartrate (LOPRESSOR) injection 2.5 mg, 2.5 mg, Intravenous, Q6H PRN, Olalere, Adewale A, MD, 2.5 mg at 04/05/19 2231 .  metoprolol tartrate (LOPRESSOR) tablet 100 mg, 100 mg, Oral, BID, Kroeger, Krista M., PA-C, 100 mg at 04/09/19 6073   Patients Current Diet:     Diet Order                      Diet regular Room service appropriate? Yes; Fluid consistency: Thin  Diet effective now                   Precautions / Restrictions Precautions Precautions: Fall Precaution Comments: Monitor for any shortness of breath Restrictions Weight Bearing Restrictions: No    Has the patient had 2 or more falls or a fall with injury in the past year? Yes   Prior Activity Level Community (5-7x/wk): very active PTA, up until covid in 03/1998, pt would play raquetball 3days/week, walked a track for 2 miles, and did a 14 station weight rotation. After  covid hit, and his gym closed, pt would still walk 1.5 miles on MWF; no AD use. 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    Prior Functional Level Self Care: Did the patient need help bathing, dressing, using the toilet or eating? Independent   Indoor Mobility: Did the patient need assistance with walking from room to room (with or without device)? Independent   Stairs: Did the patient need assistance with internal or external stairs (with or without device)? Independent   Functional Cognition: Did the patient need help planning regular tasks such as shopping or remembering to take medications? Independent   Home Assistive Devices / Equipment Home Assistive Devices/Equipment: None Home Equipment: Cane - single point   Prior Device Use: Indicate devices/aids used by the patient prior to current illness, exacerbation or injury? None of the above   Current Functional Level Cognition   Overall Cognitive Status: Within Functional Limits for tasks assessed Orientation Level: Oriented X4 General Comments: He is very motivated and would benefit from [facility] and he is in process of arranging others to care for wife and allow him to go to [facility]. Spoke with CW- and they are coordinating with son    Extremity Assessment (includes Sensation/Coordination)   Upper Extremity Assessment: RUE deficits/detail, LUE deficits/detail RUE: (Grossly 4-/5, AROM WFL) LUE: (Grossly 4-/5, AROM WFL)  Lower Extremity Assessment: Defer to PT evaluation     ADLs   Overall ADL's : Needs assistance/impaired Grooming: Wash/dry hands, Wash/dry face, Set up, Sitting Upper Body Bathing: Set up Lower Body Bathing: Maximal assistance Upper Body Dressing : Set up Lower Body Dressing: Total assistance Toilet Transfer: +2 for physical assistance, +2 for safety/equipment, Maximal assistance Toileting- Clothing Manipulation and Hygiene: Total  assistance Tub/ Shower Transfer: Maximal assistance, +2 for physical assistance, +2 for safety/equipment Functional mobility during ADLs: Moderate assistance, +2 for safety/equipment, +2 for physical assistance     Mobility   Overal bed mobility: Needs Assistance Bed Mobility: Rolling, Sidelying to Sit, Supine to Sit, Sit to Supine, Sit to Sidelying Rolling: Mod assist, Min assist, +2 for physical assistance Sidelying to sit: Mod assist, Min assist, +2 for physical assistance Supine to sit: Mod assist, Min assist, +2 for physical assistance Sit to supine: Mod assist Sit to sidelying: Mod assist, Min assist, +2 for physical assistance     Transfers   Overall transfer level: Needs assistance Equipment used: Rolling walker (2 wheeled) Transfers: Sit to/from Stand Sit to Stand: Mod assist, +2 physical assistance, From elevated surface General transfer comment: Recommend assist of 2 for safety and advancing in distance. Discussed mobility and safety with RN- left patient in BS chair to encourage OOB time     Ambulation / Gait / Stairs / Wheelchair Mobility   Ambulation/Gait Ambulation/Gait assistance: Mod assist, Min assist, +2 physical assistance Gait Distance (Feet): 14 Feet Assistive device: Rolling walker (2 wheeled) Gait Pattern/deviations: Shuffle, Wide base of support Gait velocity interpretation: 1.31 - 2.62 ft/sec, indicative of limited community ambulator     Posture / Balance Balance Overall balance assessment: Needs assistance Sitting balance-Leahy Scale: Good Standing balance support: Single extremity supported Standing balance-Leahy Scale: Fair     Special needs/care consideration BiPAP/CPAP: no CPM : no Continuous Drip IV : no Dialysis : no        Days : no Life Vest : no Oxygen : on RA Special Bed : no Trach Size : no Wound Vac (area) : no      Location : no Skin : abrasion to right knee, ecchymosis to right face  Bowel mgmt: last BM 04/09/19,  continent Bladder mgmt: continent Diabetic mgmt: no Behavioral consideration : no Chemo/radiation : no    Previous Home Environment (from acute therapy documentation) Living Arrangements: Spouse/significant other Available Help at Discharge: Family Type of Home: House Home Layout: One level Home Access: Stairs to enter Entrance Stairs-Rails: Can reach both, Left, Right Entrance Stairs-Number of Steps: 2 from garage with no handrails, but "grab bars- 2" Bathroom Shower/Tub: Multimedia programmer: Handicapped height Bathroom Accessibility: Yes How Accessible: Accessible via walker [place]: No   Discharge Living Setting Plans for Discharge Living Setting: House, Lives with (comment)(lives with wife) Type of Home at Discharge: House Discharge Home Layout: One level Discharge Home Access: Stairs to enter Entrance Stairs-Rails: Can reach both Entrance Stairs-Number of Steps: 2 Discharge Bathroom Shower/Tub: Walk-in shower Discharge Bathroom Toilet: Handicapped height Discharge Bathroom Accessibility: Yes How Accessible: Accessible via walker Does the patient have any problems obtaining your medications?: No   Social/Family/Support Systems Patient Roles: Spouse, Caregiver Contact Information: son: Louis "Higher education careers adviser" Anticipated Caregiver: Delfino Lovett and neighbor Anticipated Caregiver's Contact Information: Beverley: [phone] Ability/Limitations of Caregiver: intermittant supervisoin  Caregiver Availability: Intermittent Discharge Plan Discussed with Primary Caregiver: Yes(pt and his son) Is Caregiver In Agreement with Plan?: Yes Does Caregiver/Family have Issues with Lodging/Transportation while Pt is in Rehab?: No   Goals/Additional Needs Patient/Family Goal for Rehab: PT/OT: Mod I/Supervision; SLP: NA Expected length of stay: 10-14 days Cultural Considerations: NA Dietary Needs: regular diet, thin liquids Equipment Needs: TBD Pt/Family Agrees to Admission  and willing to participate: Yes Program Orientation Provided & Reviewed with Pt/Caregiver Including Roles  & Responsibilities: Yes(pt and his son)  Barriers to Discharge: Home environment access/layout, Lack of/limited family support  Barriers to Discharge Comments: steps to enter home; intermittant supervision only.    Decrease burden of Care through IP rehab admission: NA   Possible need for SNF placement upon discharge: Not anticipated; pt has an excellent prognosis for further progress through CIR. Pt was an active, Independent caregiver for his wife prior to hospital admission. Pt is very motivated for a return to [place] and a return to his wife at home. Feel with a CIR stay, pt will be able to reach Mod I/itermittant supervision goals for a safe DC home.    Patient Condition: I have reviewed medical records from [facility], spoken with MD, RN, and patient and son. I discussed via phone for inpatient rehabilitation assessment.  Patient will benefit from ongoing PT and OT, can actively participate in 3 hours of therapy a day 5 days of the week, and can make measurable gains during the admission.  Patient will also benefit from the coordinated team approach during an Inpatient Acute Rehabilitation admission.  The patient will receive intensive therapy as well as Rehabilitation physician, nursing, social worker, and care management interventions.  Due to safety, skin/wound care, disease management, medication administration, pain management and patient education the patient requires 24 hour a day rehabilitation nursing.  The patient is currently Mod A;MIn A + 2 for 14 feet with mobility; ADLs not yet assessed but anticipate ADL needs due to generalized weakness.  Discharge setting and therapy post discharge at home with home health is anticipated.  Patient has agreed to participate in the Acute Inpatient Rehabilitation Program and will admit 04/09/19.   Preadmission Screen Completed By:   [name], 04/09/2019 4:58 PM ______________________________________________________________________   Discussed status with Dr. Ranell Patrick on 04/09/19 at 10:30AM and received approval for admission today.  Admission Coordinator:  [name], MD, time 10:30AM/Date 3/191/21    Assessment/Plan: Diagnosis: Right knee focal radial rear at posterior horn body junction of medial meniscus 1. Does the need for close, 24 hr/day Medical supervision in concert with the patient's rehab needs make it unreasonable for this patient to be served in a less intensive setting? Yes 2. Co-Morbidities requiring supervision/potential complications: history of TIA and shingles, pulmonary embolism, popliteus tendon injury, moderate knee joint effusion 3. Due to bladder management, bowel management, safety, skin/wound care, disease management, medication administration, pain management and patient education, does the patient require 24 hr/day rehab nursing? Yes 4. Does the patient require coordinated care of a physician, rehab nurse, PT, OT to address physical and functional deficits in the context of the above medical diagnosis(es)? Yes Addressing deficits in the following areas: balance, endurance, locomotion, strength, transferring, bowel/bladder control, bathing, dressing, feeding, grooming, toileting and psychosocial support 5. Can the patient actively participate in an intensive therapy program of at least 3 hrs of therapy 5 days a week? Yes 6. The potential for patient to make measurable gains while on inpatient rehab is excellent 7. Anticipated functional outcomes upon discharge from inpatient rehab: modified independent PT, modified independent OT, independent SLP 8. Estimated rehab length of stay to reach the above functional goals is: 7-10 days 9. Anticipated discharge destination: Home 10. Overall Rehab/Functional Prognosis: excellent     MD Signature: Leeroy Cha, MD    Revision  History Date/Time User Provider Type Action  04/09/2019  4:58 PM Raulkar, Clide Deutscher, MD Physician Addend  04/09/2019 11:14 AM Ranell Patrick, Clide Deutscher, MD Physician Sign  04/09/2019 11:10 AM Raechel Ache, OT Rehab Admission Coordinator Share   View Details Report

## 2019-04-09 NOTE — Progress Notes (Signed)
D/c to [facility] condtion stable.

## 2019-04-09 NOTE — Plan of Care (Signed)
  Problem: Consults Goal: RH GENERAL PATIENT EDUCATION Description: See Patient Education module for education specifics. Outcome: Progressing   Problem: RH BOWEL ELIMINATION Goal: RH STG MANAGE BOWEL WITH ASSISTANCE Description: STG Manage Bowel with Assistance. Outcome: Progressing   Problem: RH SAFETY Goal: RH STG ADHERE TO SAFETY PRECAUTIONS W/ASSISTANCE/DEVICE Description: STG Adhere to Safety Precautions With Assistance/Device. Outcome: Progressing   Problem: RH PAIN MANAGEMENT Goal: RH STG PAIN MANAGED AT OR BELOW PT'S PAIN GOAL Outcome: Progressing   Problem: RH KNOWLEDGE DEFICIT GENERAL Goal: RH STG INCREASE KNOWLEDGE OF SELF CARE AFTER HOSPITALIZATION Outcome: Progressing

## 2019-04-09 NOTE — H&P (Signed)
Physical Medicine and Rehabilitation Admission H&P  CC: Debility with decreased functional mobility secondary to pulmonary emboli  HPI: Willie Cisneros is a 86 year old right-handed male history of TIA as well as shingles on no prescription medications.  Per chart review lives with spouse independent prior to admission.  1 level home.  He also has a son in the area who can assist.  Presented 04/03/2019 with increasing shortness of breath and chest discomfort.  He recently had sustained a left foot strain for which she had a boot in place he tripped on the boot and fell March 7 without loss of consciousness.  He did strike the right side of his head and face.  Admission chemistries unremarkable, WBC 12,000, troponin high-sensitivity 369.  Cranial CT/MRI scan showed no evidence of intracranial abnormality.  CT cervical spine negative for fracture or dislocation however there was a nondisplaced left zygomatic arch fracture.  CT angiogram of the chest positive for pulmonary embolus with findings worrisome for right heart strain.  Peripheral wedge-shaped opacity in left lower lobe worrisome for pulmonary infarct.  MRI of the right knee showed focal radial tear at the posterior horn body junction of the medial meniscus.  Probable nondisplaced horizontal tear component involving the posterior horn.  Popliteus tendon injury and muscle strain with moderate joint effusion.  Orthopedic services Dr. Ihor Gully follow-up with conservative care weightbearing as tolerated.  Cardiology service follow-up for evaluation of possible atrial fibrillation with RVR an echocardiogram ejection fraction 45%.  His elevated troponin felt to be related to demand ischemia.  Patient was started on intravenous heparin transition to Eliquis for both pulmonary emboli as well as atrial fibrillation.  He is tolerating a regular diet.  Therapy evaluations completed and patient was admitted for a comprehensive rehab program.  Review of Systems    Constitutional: Negative for chills and fever.  HENT: Negative for hearing loss.   Eyes: Negative for blurred vision and double vision.  Respiratory: Positive for shortness of breath. Negative for cough.   Cardiovascular: Positive for chest pain, palpitations and leg swelling.  Gastrointestinal: Positive for constipation. Negative for heartburn, nausea and vomiting.  Genitourinary: Positive for urgency. Negative for dysuria, flank pain and hematuria.  Musculoskeletal: Positive for myalgias.       Patient with recent fall  Skin: Negative for rash.  Psychiatric/Behavioral: The patient has insomnia.   All other systems reviewed and are negative.  Past Medical History:  Diagnosis Date  . Glaucoma   . Kidney stones   . Medical history non-contributory   . Shingles   . TIA (transient ischemic attack)    Past Surgical History:  Procedure Laterality Date  . NO PAST SURGERIES     Family History  Problem Relation Age of Onset  . Heart disease Neg Hx    Social History:  reports that he has never smoked. He has never used smokeless tobacco. He reports previous alcohol use. He reports that he does not use drugs. Allergies:  Allergies  Allergen Reactions  . Penicillins Anaphylaxis    Did it involve swelling of the face/tongue/throat, SOB, or low BP? Yes Did it involve sudden or severe rash/hives, skin peeling, or any reaction on the inside of your mouth or nose? Yes Did you need to seek medical attention at a hospital or doctor's office? Yes When did it last happen?Over 10 years ago If all above answers are "NO", may proceed with cephalosporin use.  . Sulfa Antibiotics Anaphylaxis and Rash  . Clindamycin Nausea  Only  . Contrast Media  [Iodinated Diagnostic Agents] Nausea Only  . Econazole Nitrate Itching and Other (See Comments)    Burning  . Erythromycin Nausea Only  . Oxycodone Nausea And Vomiting   Medications Prior to Admission  Medication Sig Dispense Refill  . apixaban  (ELIQUIS) 5 MG TABS tablet Take 2 tablets (10 mg total) by mouth 2 (two) times daily for 5 days, THEN 1 tablet (5 mg total) 2 (two) times daily.    Marland Kitchen latanoprost (XALATAN) 0.005 % ophthalmic solution Place 1 drop into both eyes at bedtime.     . metoprolol tartrate (LOPRESSOR) 100 MG tablet Take 1 tablet (100 mg total) by mouth 2 (two) times daily.    . Multiple Vitamins-Minerals (CENTRUM SILVER 50+MEN) TABS Take 1 tablet by mouth daily.      Drug Regimen Review Drug regimen was reviewed and remains appropriate with no significant issues identified  Home: Home Living Family/patient expects to be discharged to:: Private residence Living Arrangements: Spouse/significant other Available Help at Discharge: Family(states he has a son that is local, but is "very short term when it comes to helping out") Type of Home: House Home Access: Stairs to enter [employer] of Steps: 2 from garage with no handrails, but "grab bars- 2" Entrance Stairs-Rails: Can reach both, Left, Right Home Layout: One level Bathroom Shower/Tub: Health visitor: Handicapped height Bathroom Accessibility: Yes Home Equipment: Cane - single point(He states he has a "walking stick type cane" as well- that the cane is wife gave him was lost between ED and ICU.)   Functional History: Prior Function Level of Independence: Independent  Functional Status:  Mobility: Bed Mobility Overal bed mobility: Needs Assistance Bed Mobility: Rolling, Sidelying to Sit, Supine to Sit, Sit to Supine, Sit to Sidelying Rolling: Mod assist, Min assist, +2 for physical assistance Sidelying to sit: Mod assist, Min assist, +2 for physical assistance Supine to sit: Mod assist, Min assist, +2 for physical assistance Sit to supine: Mod assist Sit to sidelying: Mod assist, Min assist, +2 for physical assistance Transfers Overall transfer level: Needs assistance Equipment used: Rolling walker (2 wheeled) Transfers:  Sit to/from Stand Sit to Stand: Mod assist, Min assist, +2 physical assistance General transfer comment: Recommend assist of 2 for safety and advancing in distance. Discussed mobility and safety with RN- left patient in BS chair to encourage OOB time Ambulation/Gait Ambulation/Gait assistance: Mod assist, Min assist, +2 physical assistance Gait Distance (Feet): 14 Feet Assistive device: Rolling walker (2 wheeled) Gait Pattern/deviations: Shuffle, Wide base of support Gait velocity interpretation: 1.31 - 2.62 ft/sec, indicative of limited community ambulator  ADL:  Cognition: Cognition Overall Cognitive Status: Within Functional Limits for tasks assessed Orientation Level: Oriented X4 Cognition Arousal/Alertness: Awake/alert Behavior During Therapy: WFL for tasks assessed/performed Overall Cognitive Status: Within Functional Limits for tasks assessed General Comments: He is very motivated and would benefit from [facility] and he is in process of arranging others to care for wife and allow him to go to [facility]. Spoke with CW- and they are coordinating with son   Physical Exam: Blood pressure 118/85, pulse 89, temperature 98.1 F (36.7 C), temperature source Oral, resp. rate 18, height 5\' 10"  (1.778 m), weight 81.2 kg, SpO2 97 %.   Physical Exam  General: Alert and oriented x 3, No apparent distress HEENT: Head is normocephalic, atraumatic, PERRLA, EOMI, sclera anicteric, oral mucosa pink and moist, dentition intact, ext ear canals clear,  Neck: Supple without JVD or lymphadenopathy Heart: Reg rate  and rhythm. No murmurs rubs or gallops Chest: CTA bilaterally without wheezes, rales, or rhonchi; no distress Abdomen: Soft, non-tender, non-distended, bowel sounds positive. Extremities: +RLE edema. Pulses are 2+ Skin: Clean and intact without signs of breakdown Neuro/Musculoskeletal:  : Alert pleasant no acute distress follows commands oriented x3. Strength is diffusely 4/5 with the  exception of RLE which is limited by pain.  Psych: Pt's affect is appropriate. Pt is cooperative  Results for orders placed or performed during the hospital encounter of 04/03/19 (from the past 48 hour(s))  CBC     Status: Abnormal   Collection Time: 04/08/19  5:13 AM  Result Value Ref Range   WBC 8.6 4.0 - 10.5 K/uL   RBC 4.19 (L) 4.22 - 5.81 MIL/uL   Hemoglobin 12.7 (L) 13.0 - 17.0 g/dL   HCT 44.8 (L) 18.5 - 63.1 %   MCV 92.6 80.0 - 100.0 fL   MCH 30.3 26.0 - 34.0 pg   MCHC 32.7 30.0 - 36.0 g/dL   RDW 49.7 02.6 - 37.8 %   Platelets 144 (L) 150 - 400 K/uL   nRBC 0.0 0.0 - 0.2 %    Comment: Performed at [facility], [address], [place], [place] [address]   No results found.  Medical Problem List and Plan: 1.  Debility with decreased functional mobility secondary to pulmonary emboli  -patient may shower  -ELOS/Goals: modI 7-10 days  -Admit to CIR; WBAT 2.  Antithrombotics: -DVT/anticoagulation:  Eliquis 10mg  BID  -antiplatelet therapy: N/A 3. Pain Management: Tylenol as needed 4. Mood: provide emotional support  -antipsychotic agents: N/A 5. Neuropsych: This patient is capable of making decisions on his own behalf. 6. Skin/Wound Care: Routine skin checks 7. Fluids/Electrolytes/Nutrition: Routine in and outs with follow-up chemistries 8.  Atrial fibrillation.  Continue Eliquis as well as Lopressor 100 mg twice daily.  Cardiac rate controlled.  Follow-up cardiology services 9.  Right knee focal radial tear at the posterior horn body junction of the medial meniscus.  Weightbearing as tolerated and follow-up orthopedic services Dr. 10.  Nondisplaced left zygomatic arch fracture.  Conservative care 11.  Left foot strain from recent fall.  Patient does wear a boot as needed 12. Elevated troponin 2/2 demand ischemia: Monitor for chest pain 13. Disposition: Lives with spouse who has dementia; he is her caretaker. Has a son in the area who can assist.    Charlann Boxer, PA-C  I have personally performed a face to face diagnostic evaluation, including, but not limited to relevant history and physical exam findings, of this patient and developed relevant assessment and plan.  Additionally, I have reviewed and concur with the physician assistant's documentation above.  The patient's status has not changed. The original post admission physician evaluation remains appropriate, and any changes from the pre-admission screening or documentation from the acute chart are noted above.   Malissa Hippo, MD

## 2019-04-09 NOTE — Progress Notes (Signed)
Recd pt from off going RN. Condition stable. No change in initial am assessment at this time. Cont with plan of care 

## 2019-04-09 NOTE — Discharge Summary (Signed)
Physician Discharge Summary  Willie Cisneros TDV:[id number] DOB: 02-13-1926 DOA: 04/03/2019  PCP: Gweneth Dimitri, MD  Admit date: 04/03/2019 Discharge date: 04/09/2019  Admitted From: Home Disposition: Inpatient rehabilitation  Recommendations for Outpatient Follow-up:  1. Follow up with PCP in 1 week 2. Follow up with Cardiology on March 30; if still in rehab, may need to reschedule 3. Needs prescriptions for metoprolol and Eliquis on discharge 4. Please follow up on the following pending results: None  Discharge Condition: Stable CODE STATUS: Full code Diet recommendation: Heart healthy   Brief/Interim Summary:  Admission HPI written by Tomma Lightning, MD   Brief History  Elderly gentleman who presented with shortness of breath and chest discomfort. He recently had sustained a left foot strain for which he had a boot, he tripped on the boot and fell on March 7.  He did hit his head right side of his face, stated his head bounced off the concrete about twice.  There was no loss of consciousness. Shortness of breath with minimal exertion, left-sided chest pains He has had some pain in his right knee as well following the fall Unable to take deep breaths because of pain   Hospital course:  Pulmonary embolism RLE DVT Patient with some evidence of mild RV dysfunction on Transthoracic Echocardiogram. HS Troponin elevated to a peak of 450. Patient managed initially on heparin drip and transitioned to Eliquis. Patient is not requiring oxygen on discharge and is asymptomatic.  Right LE weakness Likely secondary to knee effusion/meniscus tear/popliteus tendon injury. Orthopedic surgery consulted and aspirated knee on 3/16. Some improvement in pain mostly with movement. Physical therapy evaluated and recommended inpatient rehabilitation. Orthopedic surgery recommendations are to allow weight bearing as tolerated.  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 Transthoracic Echocardiogram was read as patient having an EF of 40-45%. Cardiology was consulted with assessment of a likely EF of 50-55%. Patient was titrated up on metoprolol and will discharged on metoprolol 100 mg BID. He will need outpatient follow-up and they will consider a possible DCCV in the future.  Discharge Diagnoses:  Active Problems:   Pulmonary embolism [facility])    Discharge Instructions   Allergies as of 04/09/2019      Reactions   Penicillins Anaphylaxis   Did it involve swelling of the face/tongue/throat, SOB, or low BP? Yes Did it involve sudden or severe rash/hives, skin peeling, or any reaction on the inside of your mouth or nose? Yes Did you need to seek medical attention at a hospital or doctor's office? Yes When did it last happen?Over 10 years ago If all above answers are "NO", may proceed with cephalosporin use.   Sulfa Antibiotics Anaphylaxis, Rash   Clindamycin Nausea Only   Contrast Media  [iodinated Diagnostic Agents] Nausea Only   Econazole Nitrate Itching, Other (See Comments)   Burning   Erythromycin Nausea Only   Oxycodone Nausea And Vomiting      Medication List    TAKE these medications   apixaban 5 MG Tabs tablet Commonly known as: ELIQUIS Take 2 tablets (10 mg total) by mouth 2 (two) times daily for 5 days, THEN 1 tablet (5 mg total) 2 (two) times daily. Start taking on: April 09, 2019   Centrum Silver 50+Men Tabs Take 1 tablet by mouth daily.   latanoprost 0.005 % ophthalmic solution Commonly known as: XALATAN Place 1 drop into both eyes at bedtime.   metoprolol tartrate 100 MG tablet Commonly known as:  LOPRESSOR Take 1 tablet (100 mg total) by mouth 2 (two) times daily.      Follow-up Information    Richardson Dopp T, PA-C Follow up on 04/20/2019.   Specialties: Cardiology, Physician Assistant Why: Please arrive 15 minutes early for your 11:15am post-hospital cardiology follow-up appointment Contact  information: [address] [place] [address] [phone]        Cari Caraway, MD. Schedule an appointment as soon as possible for a visit in 1 week(s).   Specialty: Family Medicine Why: Hospital follow-up Contact information: [place] [address] [phone]          Allergies  Allergen Reactions  . Penicillins Anaphylaxis    Did it involve swelling of the face/tongue/throat, SOB, or low BP? Yes Did it involve sudden or severe rash/hives, skin peeling, or any reaction on the inside of your mouth or nose? Yes Did you need to seek medical attention at a hospital or doctor's office? Yes When did it last happen?Over 10 years ago If all above answers are "NO", may proceed with cephalosporin use.  . Sulfa Antibiotics Anaphylaxis and Rash  . Clindamycin Nausea Only  . Contrast Media  [Iodinated Diagnostic Agents] Nausea Only  . Econazole Nitrate Itching and Other (See Comments)    Burning  . Erythromycin Nausea Only  . Oxycodone Nausea And Vomiting    Consultations:  Cardiology  PCCM   Procedures/Studies: DG Chest 2 View  Result Date: 04/03/2019 CLINICAL DATA:  Left chest pain EXAM: CHEST - 2 VIEW COMPARISON:  None. FINDINGS: Mild blunting left costophrenic angle, favored to reflect epicardial fat, clear on the lateral view. Right lung is clear. No pleural effusion or pneumothorax. The heart is top-normal in size. Visualized osseous structures are within normal limits. IMPRESSION: Normal chest radiographs. Electronically Signed   By: Julian Hy M.D.   On: 04/03/2019 09:22   CT Head Wo Contrast  Result Date: 04/03/2019 CLINICAL DATA:  Fall a few days prior. Right orbital bruise. Posterior neck pain. EXAM: CT HEAD WITHOUT CONTRAST CT CERVICAL SPINE WITHOUT CONTRAST TECHNIQUE: Multidetector CT imaging of the head and cervical spine was performed following the standard protocol without intravenous contrast. Multiplanar CT  image reconstructions of the cervical spine were also generated. COMPARISON:  None. FINDINGS: CT HEAD FINDINGS Brain: No evidence of parenchymal hemorrhage or extra-axial fluid collection. No mass lesion, mass effect, or midline shift. No CT evidence of acute infarction. Nonspecific mild subcortical and periventricular white matter hypodensity, most in keeping with chronic small vessel ischemic change. Cerebral volume is age appropriate. No ventriculomegaly. Vascular: No acute abnormality. Skull: No evidence of calvarial fracture. Nondisplaced left zygomatic arch fracture appears well corticated and chronic, with no overlying soft tissue swelling. Sinuses/Orbits: The visualized paranasal sinuses are essentially clear. Other:  The mastoid air cells are unopacified. CT CERVICAL SPINE FINDINGS Alignment: Straightening of the cervical spine. No facet subluxation. Dens is well positioned between the lateral masses of C1. Mild 3 mm retrolisthesis at C5-6 and C6-7 and 3 mm anterolisthesis at C7-T1. Skull base and vertebrae: No acute fracture. No primary bone lesion or focal pathologic process. Soft tissues and spinal canal: No prevertebral edema. No visible canal hematoma. Disc levels: Severe multilevel degenerative disc disease throughout the cervical spine, most prominent at C3-4, C5-6 and C6-7. Moderate to severe bilateral cervical facet arthropathy. Moderate degenerative foraminal stenosis bilaterally at C5-6. Upper chest: No acute abnormality. Other: Visualized mastoid air cells appear clear. No discrete thyroid nodules. No  pathologically enlarged cervical nodes. IMPRESSION: 1. No evidence of acute intracranial abnormality. No evidence of calvarial fracture. 2. Mild chronic small vessel ischemic changes in the cerebral white matter. 3. Nondisplaced left zygomatic arch fracture appears well corticated and chronic, with no overlying soft tissue swelling. 4. No cervical spine fracture or facet subluxation. 5. Advanced  multilevel degenerative changes in the cervical spine as detailed. 6. Mild multilevel cervical spondylolisthesis is probably degenerative. Electronically Signed   By: Delbert Phenix M.D.   On: 04/03/2019 11:56   CT Angio Chest PE W and/or Wo Contrast  Result Date: 04/03/2019 CLINICAL DATA:  Severe left chest pain today. EXAM: CT ANGIOGRAPHY CHEST WITH CONTRAST TECHNIQUE: Multidetector CT imaging of the chest was performed using the standard protocol during bolus administration of intravenous contrast. Multiplanar CT image reconstructions and MIPs were obtained to evaluate the vascular anatomy. CONTRAST:  100 mL OMNIPAQUE IOHEXOL 350 MG/ML SOLN COMPARISON:  PA and lateral chest today. FINDINGS: Cardiovascular: The examination is positive for pulmonary embolus. Clot is best seen in the left main pulmonary artery and its branches to both the upper and lower lobes. The RV to LV ratio is 1.22 worrisome for right heart strain. Cardiomegaly and calcific aortic and coronary atherosclerosis are noted. No pericardial effusion. Thoracic aorta is normal in caliber. Mediastinum/Nodes: No enlarged mediastinal, hilar, or axillary lymph nodes. Thyroid gland, trachea, and esophagus demonstrate no significant findings. Lungs/Pleura: Very small left pleural effusion is seen. Wedge-shaped peripheral opacity in the left lower lobe is worrisome for a pulmonary infarct. Dependent atelectasis is noted. Upper Abdomen: Single, small nonobstructing stones are seen in each kidney Musculoskeletal: No fracture or worrisome lesion. Multilevel spondylosis and mild scoliosis noted. Review of the MIP images confirms the above findings. IMPRESSION: The examination is positive for pulmonary embolus with findings worrisome for right heart strain. Peripheral wedge-shaped opacity in left lower lobe is worrisome for a pulmonary infarct. Cardiomegaly. Critical Value/emergent results were called by telephone at the time of interpretation on 04/03/2019 at  11:52 am to provider MELANIE BELFI , who verbally acknowledged these results. Aortic Atherosclerosis (ICD10-I70.0). Calcific coronary artery disease also noted. Electronically Signed   By: Drusilla Kanner M.D.   On: 04/03/2019 11:55   CT Cervical Spine Wo Contrast  Result Date: 04/03/2019 CLINICAL DATA:  Fall a few days prior. Right orbital bruise. Posterior neck pain. EXAM: CT HEAD WITHOUT CONTRAST CT CERVICAL SPINE WITHOUT CONTRAST TECHNIQUE: Multidetector CT imaging of the head and cervical spine was performed following the standard protocol without intravenous contrast. Multiplanar CT image reconstructions of the cervical spine were also generated. COMPARISON:  None. FINDINGS: CT HEAD FINDINGS Brain: No evidence of parenchymal hemorrhage or extra-axial fluid collection. No mass lesion, mass effect, or midline shift. No CT evidence of acute infarction. Nonspecific mild subcortical and periventricular white matter hypodensity, most in keeping with chronic small vessel ischemic change. Cerebral volume is age appropriate. No ventriculomegaly. Vascular: No acute abnormality. Skull: No evidence of calvarial fracture. Nondisplaced left zygomatic arch fracture appears well corticated and chronic, with no overlying soft tissue swelling. Sinuses/Orbits: The visualized paranasal sinuses are essentially clear. Other:  The mastoid air cells are unopacified. CT CERVICAL SPINE FINDINGS Alignment: Straightening of the cervical spine. No facet subluxation. Dens is well positioned between the lateral masses of C1. Mild 3 mm retrolisthesis at C5-6 and C6-7 and 3 mm anterolisthesis at C7-T1. Skull base and vertebrae: No acute fracture. No primary bone lesion or focal pathologic process. Soft tissues and spinal canal: No prevertebral  edema. No visible canal hematoma. Disc levels: Severe multilevel degenerative disc disease throughout the cervical spine, most prominent at C3-4, C5-6 and C6-7. Moderate to severe bilateral  cervical facet arthropathy. Moderate degenerative foraminal stenosis bilaterally at C5-6. Upper chest: No acute abnormality. Other: Visualized mastoid air cells appear clear. No discrete thyroid nodules. No pathologically enlarged cervical nodes. IMPRESSION: 1. No evidence of acute intracranial abnormality. No evidence of calvarial fracture. 2. Mild chronic small vessel ischemic changes in the cerebral white matter. 3. Nondisplaced left zygomatic arch fracture appears well corticated and chronic, with no overlying soft tissue swelling. 4. No cervical spine fracture or facet subluxation. 5. 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 6. Mild multilevel cervical spondylolisthesis is probably degenerative. Electronically Signed   By: Delbert Phenix M.D.   On: 04/03/2019 11:56   MR BRAIN WO CONTRAST  Result Date: 04/05/2019 CLINICAL DATA:  Post fall, possible CVA EXAM: MRI HEAD WITHOUT CONTRAST TECHNIQUE: Multiplanar, multiecho pulse sequences of the brain and surrounding structures were obtained without intravenous contrast. COMPARISON:  None. FINDINGS: Brain: There is no acute infarction or intracranial hemorrhage. There is no intracranial mass, mass effect, or edema. There is no hydrocephalus or extra-axial fluid collection. Patchy foci of T2 hyperintensity in the supratentorial white matter are but may mild chronic microvascular ischemic changes. 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 Vascular: Major vessel flow voids at the skull base are preserved. Skull and upper cervical spine: Normal marrow signal is preserved. Sinuses/Orbits: Paranasal sinuses are aerated. Bilateral lens replacements. Other: Sella is unremarkable.  Mastoid air cells are clear. IMPRESSION: No evidence of recent infarction, hemorrhage, or mass. Mild chronic microvascular ischemic changes. Electronically Signed   By: Guadlupe Spanish M.D.   On: 04/05/2019 14:55   MR ZOXWR RIGHT WO  CONTRAST  Result Date: 04/05/2019 CLINICAL DATA:  Right thigh pain. Fall week ago. EXAM: MRI OF THE RIGHT FEMUR WITHOUT CONTRAST TECHNIQUE: Multiplanar, multisequence MR imaging of the right femur was performed. No intravenous contrast was administered. COMPARISON:  Right knee x-rays dated April 03, 2019. FINDINGS: Bones/Joint/Cartilage No marrow signal abnormality. No fracture or dislocation. Normal alignment. Small right knee joint effusion. Trace fluid in the right greater trochanteric bursa. Muscles and Tendons Intact. Mild fatty infiltration of the semimembranosus and biceps femoris muscles. No muscle edema. Soft tissue No fluid collection or hematoma. No soft tissue mass. IMPRESSION: No acute injury of the right femur or thigh. Electronically Signed   By: Obie Dredge M.D.   On: 04/05/2019 15:20   MR KNEE RIGHT WO CONTRAST  Result Date: 04/06/2019 CLINICAL DATA:  Right knee pain after fall 1 week ago EXAM: MRI OF THE RIGHT KNEE WITHOUT CONTRAST TECHNIQUE: Multiplanar, multisequence MR imaging of the knee was performed. No intravenous contrast was administered. COMPARISON:  X-ray 04/03/2019. Right femur MRI 04/05/2019 FINDINGS: MENISCI Medial meniscus: Focal radial tear at the posterior horn-body junction (series 5, image 22; series 10, image 6). Possible nondisplaced horizontal tear of the posterior horn (series 10, image 7). Mild intrasubstance degeneration of the posterior horn and body. Lateral meniscus:  Intact. LIGAMENTS Cruciates:  Intact ACL and PCL. Collaterals: Medial collateral ligament is intact. Lateral collateral ligament complex is intact. CARTILAGE Patellofemoral: Trochlear chondral thinning without focal defect. Patellar cartilage intact. Medial: Mild partial-thickness cartilage loss of the medial femorotibial compartment. Lateral:  No chondral defect. Joint:  Moderate joint effusion. Unremarkable fat pads. Popliteal Fossa: Distal popliteus tendon is thickened and heterogeneous (series  8, image 10)  with intramuscular edema within the popliteus muscle belly. No Baker's cyst. Extensor Mechanism: Intact quadriceps tendon and patellar tendon. Mild distal patellar tendinosis. Bones: No focal marrow signal abnormality. No fracture or dislocation. Other: Mild prepatellar soft tissue edema. Small ganglion posteromedial to the fibular head. IMPRESSION: 1. Focal radial tear at the posterior horn-body junction of the medial meniscus. Probable nondisplaced horizontal tear component involving the posterior horn. 2. Popliteus tendon injury and muscle strain. 3. Moderate joint effusion. Electronically Signed   By: Duanne Guess D.O.   On: 04/06/2019 10:51   DG Knee Complete 4 Views Right  Result Date: 04/03/2019 CLINICAL DATA:  Acute RIGHT knee pain following fall several days ago. Initial encounter. EXAM: RIGHT KNEE - COMPLETE 4+ VIEW COMPARISON:  None. FINDINGS: No acute fracture, subluxation or dislocation identified. Minimal tricompartmental joint space narrowing/osteophytosis noted. There is no evidence of joint effusion. No focal bony lesions are present. Heavy vascular calcifications are identified. IMPRESSION: 1. No evidence of acute abnormality. 2. Minimal tricompartmental degenerative changes. Electronically Signed   By: Harmon Pier M.D.   On: 04/03/2019 09:55   ECHOCARDIOGRAM COMPLETE  Result Date: 04/03/2019    ECHOCARDIOGRAM REPORT   Patient Name:   CAROLE DEERE Date of Exam: 04/03/2019 Medical Rec #:  [id number]        Height:       69.0 in Accession #:    [id number]       Weight:       175.0 lb Date of Birth:  03-Jun-1926        BSA:          1.952 m Patient Age:    86 years         BP:           126/68 mmHg Patient Gender: M                HR:           120 bpm. Exam Location:  Inpatient Procedure: 2D Echo, Color Doppler and Cardiac Doppler STAT ECHO Indications:    I26.02 Pulmonary embolus  History:        Patient has no prior history of Echocardiogram examinations.  Sonographer:     Irving Burton Senior RDCS Referring Phys: [id number] Tomma Lightning  Sonographer Comments: Technically difficult study due to poor echo windows and irregular rhythm. IMPRESSIONS  1. Left ventricular ejection fraction, by estimation, is 40 to 45% with beat to beat variability. The left ventricle has mildly decreased function. Left ventricular endocardial border not optimally defined to evaluate regional wall motion. There is moderate left ventricular hypertrophy.  2. Right ventricular systolic function is mildly reduced. The right ventricular size is normal. Tricuspid regurgitation signal is inadequate for assessing PA pressure.  3. Left atrial size was mildly dilated.  4. The mitral valve is normal in structure. Trivial mitral valve regurgitation. No evidence of mitral stenosis.  5. The aortic valve is abnormal. Aortic valve regurgitation is not visualized. No aortic stenosis is present.  6. Aortic dilatation noted. There is borderline dilatation of the ascending aorta measuring 39 mm.  7. The inferior vena cava is normal in size with greater than 50% respiratory variability, suggesting right atrial pressure of 3 mmHg. FINDINGS  Left Ventricle: Left ventricular ejection fraction, by estimation, is 40 to 45%. The left ventricle has mildly decreased function. Left ventricular endocardial border not optimally defined to evaluate regional wall motion. The left ventricular internal cavity size was normal in size. There  is moderate left ventricular hypertrophy. Left ventricular diastolic parameters are indeterminate. Right Ventricle: The right ventricular size is normal. Right vetricular wall thickness was not assessed. Right ventricular systolic function is mildly reduced. Tricuspid regurgitation signal is inadequate for assessing PA pressure. Left Atrium: Left atrial size was mildly dilated. Right Atrium: Right atrial size was normal in size. Pericardium: There is no evidence of pericardial effusion. Mitral Valve: The mitral  valve is normal in structure. Normal mobility of the mitral valve leaflets. Trivial mitral valve regurgitation. No evidence of mitral valve stenosis. Tricuspid Valve: The tricuspid valve is normal in structure. Tricuspid valve regurgitation is trivial. No evidence of tricuspid stenosis. Aortic Valve: The aortic valve is abnormal. Aortic valve regurgitation is not visualized. No aortic stenosis is present. There is mild calcification of the aortic valve. Pulmonic Valve: The pulmonic valve was normal in structure. Pulmonic valve regurgitation is not visualized. No evidence of pulmonic stenosis. Aorta: Aortic dilatation noted. There is borderline dilatation of the ascending aorta measuring 39 mm. Venous: The inferior vena cava is normal in size with greater than 50% respiratory variability, suggesting right atrial pressure of 3 mmHg. IAS/Shunts: No atrial level shunt detected by color flow Doppler.  LEFT VENTRICLE PLAX 2D LVIDd:         4.60 cm LVIDs:         3.80 cm LV PW:         1.40 cm LV IVS:        1.50 cm LVOT diam:     2.00 cm LV SV:         38 LV SV Index:   20 LVOT Area:     3.14 cm  RIGHT VENTRICLE RV S prime:     11.00 cm/s TAPSE (M-mode): 1.6 cm LEFT ATRIUM             Index       RIGHT ATRIUM           Index LA diam:        4.20 cm 2.15 cm/m  RA Area:     16.90 cm LA Vol (A2C):   68.4 ml 35.04 ml/m RA Volume:   36.20 ml  18.54 ml/m LA Vol (A4C):   75.3 ml 38.58 ml/m LA Biplane Vol: 73.8 ml 37.81 ml/m  AORTIC VALVE LVOT Vmax:   75.53 cm/s LVOT Vmean:  55.467 cm/s LVOT VTI:    0.122 m                          PULMONARY ARTERY AORTA                    MPA diam:        2.30 cm Ao Root diam: 2.70 cm Ao Asc diam:  3.70 cm  SHUNTS Systemic VTI:  0.12 m Systemic Diam: 2.00 cm Weston BrassGayatri Acharya MD Electronically signed by Weston BrassGayatri Acharya MD Signature Date/Time: 04/03/2019/3:13:54 PM    Final       TRANSTHORACIC ECHOCARDIOGRAM (3/13) IMPRESSIONS    1. Left ventricular ejection fraction, by estimation,  is 40 to 45% with  beat to beat variability. The left ventricle has mildly decreased  function. Left ventricular endocardial border not optimally defined to  evaluate regional wall motion. There is  moderate left ventricular hypertrophy.  2. Right ventricular systolic function is mildly reduced. The right  ventricular size is normal. Tricuspid regurgitation signal is inadequate  for assessing PA pressure.  3.  Left atrial size was mildly dilated.  4. The mitral valve is normal in structure. Trivial mitral valve  regurgitation. No evidence of mitral stenosis.  5. The aortic valve is abnormal. Aortic valve regurgitation is not  visualized. No aortic stenosis is present.  6. Aortic dilatation noted. There is borderline dilatation of the  ascending aorta measuring 39 mm.  7. The inferior vena cava is normal in size with greater than 50%  respiratory variability, suggesting right atrial pressure of 3 mmHg.   Subjective: No issues overnight.  Discharge Exam: Vitals:   04/08/19 2036 04/09/19 0547  BP: 116/78 110/80  Pulse: 91 (!) 106  Resp: 18 18  Temp: 98.4 F (36.9 C) 98.2 F (36.8 C)  SpO2: 92% 92%   Vitals:   04/08/19 1350 04/08/19 1621 04/08/19 2036 04/09/19 0547  BP: 121/73  116/78 110/80  Pulse: 97  91 (!) 106  Resp: (!) Temp: (!) 97.5 F (36.4 C)  98.4 F (36.9 C) 98.2 F (36.8 C)  TempSrc: Oral  Oral Oral  SpO2: 95% 97% 92% 92%  Weight:    80.3 kg  Height:        General: Pt is alert, awake, not in acute distress Cardiovascular: RRR, S1/S2 +, no rubs, no gallops Respiratory: CTA bilaterally, no wheezing, no rhonchi Abdominal: Soft, NT, ND, bowel sounds + Extremities: RLE edema, no cyanosis    The results of significant diagnostics from this hospitalization (including imaging, microbiology, ancillary and laboratory) are listed below for reference.     Microbiology: Recent Results (from the past 240 hour(s))  Respiratory Panel by RT PCR  (Flu A&B, Covid) - Nasopharyngeal Swab     Status: None   Collection Time: 04/03/19 12:09 PM   Specimen: Nasopharyngeal Swab  Result Value Ref Range Status   SARS Coronavirus 2 by RT PCR NEGATIVE NEGATIVE Final    Comment: (NOTE) SARS-CoV-2 target nucleic acids are NOT DETECTED. The SARS-CoV-2 RNA is generally detectable in upper respiratoy specimens during the acute phase of infection. The lowest concentration of SARS-CoV-2 viral copies this assay can detect is 131 copies/mL. A negative result does not preclude SARS-Cov-2 infection and should not be used as the sole basis for treatment or other patient management decisions. A negative result may occur with  improper specimen collection/handling, submission of specimen other than nasopharyngeal swab, presence of viral mutation(s) within the areas targeted by this assay, and inadequate number of viral copies (<131 copies/mL). A negative result must be combined with clinical observations, patient history, and epidemiological information. The expected result is Negative. Fact Sheet for Patients:  https://www.moore.com/ Fact Sheet for Healthcare Providers:  https://www.young.biz/ This test is not yet ap proved or cleared by the [place] FDA and  has been authorized for detection and/or diagnosis of SARS-CoV-2 by FDA under an Emergency Use Authorization (EUA). This EUA will remain  in effect (meaning this test can be used) for the duration of the COVID-19 declaration under Section 564(b)(1) of the Act, 21 U.S.C. section 360bbb-3(b)(1), unless the authorization is terminated or revoked sooner.    Influenza A by PCR NEGATIVE NEGATIVE Final   Influenza B by PCR NEGATIVE NEGATIVE Final    Comment: (NOTE) The Xpert Xpress SARS-CoV-2/FLU/RSV assay is intended as an aid in  the diagnosis of influenza from Nasopharyngeal swab specimens and  should not be used as a sole basis for treatment. Nasal  washings and  aspirates are unacceptable for Xpert Xpress SARS-CoV-2/FLU/RSV  testing. Fact Sheet  for Patients: https://www.moore.com/ Fact Sheet for Healthcare Providers: https://www.young.biz/ This test is not yet approved or cleared by the [place] FDA and  has been authorized for detection and/or diagnosis of SARS-CoV-2 by  FDA under an Emergency Use Authorization (EUA). This EUA will remain  in effect (meaning this test can be used) for the duration of the  Covid-19 declaration under Section 564(b)(1) of the Act, 21  U.S.C. section 360bbb-3(b)(1), unless the authorization is  terminated or revoked. Performed at [facility], [address], [place], [place] [address]   MRSA PCR Screening     Status: None   Collection Time: 04/03/19  3:42 PM   Specimen: Nasal Mucosa; Nasopharyngeal  Result Value Ref Range Status   MRSA by PCR NEGATIVE NEGATIVE Final    Comment:        The GeneXpert MRSA Assay (FDA approved for NASAL specimens only), is one component of a comprehensive MRSA colonization surveillance program. It is not intended to diagnose MRSA infection nor to guide or monitor treatment for MRSA infections. Performed at [facility], [address], [place], [place] [address]      Labs: BNP (last 3 results) Recent Labs    04/03/19 0926  BNP 125.9*   Basic Metabolic Panel: Recent Labs  Lab 04/03/19 0917 04/04/19 0346 04/05/19 1112 04/06/19 0619  NA 138 138  --  135  K 4.6 4.1  --  4.2  CL 104 102  --  99  CO2 24 25  --  23  GLUCOSE 146* 131*  --  134*  BUN 17 18  --  16  CREATININE 1.00 0.96  --  0.93  CALCIUM 9.0 8.6*  --  8.4*  MG  --   --  2.2  --    Liver Function Tests: No results for input(s): AST, ALT, ALKPHOS, BILITOT, PROT, ALBUMIN in the last 168 hours. No results for input(s): LIPASE, AMYLASE in the last 168 hours. No results for input(s): AMMONIA in the last  168 hours. CBC: Recent Labs  Lab 04/04/19 0346 04/05/19 0451 04/06/19 0619 04/07/19 0452 04/08/19 0513  WBC 10.0 9.9 9.9 8.3 8.6  HGB 12.8* 12.6* 12.1* 12.2* 12.7*  HCT 40.8 38.8* 37.2* 37.3* 38.8*  MCV 96.7 95.1 94.4 92.8 92.6  PLT 141* 144* 136* 132* 144*   Cardiac Enzymes: No results for input(s): CKTOTAL, CKMB, CKMBINDEX, TROPONINI in the last 168 hours. BNP: Invalid input(s): POCBNP CBG: No results for input(s): GLUCAP in the last 168 hours. D-Dimer No results for input(s): DDIMER in the last 72 hours. Hgb A1c No results for input(s): HGBA1C in the last 72 hours. Lipid Profile No results for input(s): CHOL, HDL, LDLCALC, TRIG, CHOLHDL, LDLDIRECT in the last 72 hours. Thyroid function studies No results for input(s): TSH, T4TOTAL, T3FREE, THYROIDAB in the last 72 hours.  Invalid input(s): FREET3 Anemia work up No results for input(s): VITAMINB12, FOLATE, FERRITIN, TIBC, IRON, RETICCTPCT in the last 72 hours. Urinalysis No results found for: COLORURINE, APPEARANCEUR, LABSPEC, PHURINE, GLUCOSEU, HGBUR, BILIRUBINUR, KETONESUR, PROTEINUR, UROBILINOGEN, NITRITE, LEUKOCYTESUR Sepsis Labs Invalid input(s): PROCALCITONIN,  WBC,  LACTICIDVEN Microbiology Recent Results (from the past 240 hour(s))  Respiratory Panel by RT PCR (Flu A&B, Covid) - Nasopharyngeal Swab     Status: None   Collection Time: 04/03/19 12:09 PM   Specimen: Nasopharyngeal Swab  Result Value Ref Range Status   SARS Coronavirus 2 by RT PCR NEGATIVE NEGATIVE Final    Comment: (NOTE) SARS-CoV-2 target nucleic acids  are NOT DETECTED. The SARS-CoV-2 RNA is generally detectable in upper respiratoy specimens during the acute phase of infection. The lowest concentration of SARS-CoV-2 viral copies this assay can detect is 131 copies/mL. A negative result does not preclude SARS-Cov-2 infection and should not be used as the sole basis for treatment or other patient management decisions. A negative result may  occur with  improper specimen collection/handling, submission of specimen other than nasopharyngeal swab, presence of viral mutation(s) within the areas targeted by this assay, and inadequate number of viral copies (<131 copies/mL). A negative result must be combined with clinical observations, patient history, and epidemiological information. The expected result is Negative. Fact Sheet for Patients:  https://www.moore.com/ Fact Sheet for Healthcare Providers:  https://www.young.biz/ This test is not yet ap proved or cleared by the [place] FDA and  has been authorized for detection and/or diagnosis of SARS-CoV-2 by FDA under an Emergency Use Authorization (EUA). This EUA will remain  in effect (meaning this test can be used) for the duration of the COVID-19 declaration under Section 564(b)(1) of the Act, 21 U.S.C. section 360bbb-3(b)(1), unless the authorization is terminated or revoked sooner.    Influenza A by PCR NEGATIVE NEGATIVE Final   Influenza B by PCR NEGATIVE NEGATIVE Final    Comment: (NOTE) The Xpert Xpress SARS-CoV-2/FLU/RSV assay is intended as an aid in  the diagnosis of influenza from Nasopharyngeal swab specimens and  should not be used as a sole basis for treatment. Nasal washings and  aspirates are unacceptable for Xpert Xpress SARS-CoV-2/FLU/RSV  testing. Fact Sheet for Patients: https://www.moore.com/ Fact Sheet for Healthcare Providers: https://www.young.biz/ This test is not yet approved or cleared by the [place] FDA and  has been authorized for detection and/or diagnosis of SARS-CoV-2 by  FDA under an Emergency Use Authorization (EUA). This EUA will remain  in effect (meaning this test can be used) for the duration of the  Covid-19 declaration under Section 564(b)(1) of the Act, 21  U.S.C. section 360bbb-3(b)(1), unless the authorization is  terminated or  revoked. Performed at [facility], [address], [place], [place] [address]   MRSA PCR Screening     Status: None   Collection Time: 04/03/19  3:42 PM   Specimen: Nasal Mucosa; Nasopharyngeal  Result Value Ref Range Status   MRSA by PCR NEGATIVE NEGATIVE Final    Comment:        The GeneXpert MRSA Assay (FDA approved for NASAL specimens only), is one component of a comprehensive MRSA colonization surveillance program. It is not intended to diagnose MRSA infection nor to guide or monitor treatment for MRSA infections. Performed at [facility], [address], [place], [place] [address]      Time coordinating discharge: 35 minutes  SIGNED:   Jacquelin Hawking, MD [facility] 04/09/2019, 11:58 AM

## 2019-04-10 ENCOUNTER — Inpatient Hospital Stay (HOSPITAL_COMMUNITY): Payer: Medicare Other

## 2019-04-10 ENCOUNTER — Inpatient Hospital Stay (HOSPITAL_COMMUNITY): Payer: Medicare Other | Admitting: Occupational Therapy

## 2019-04-10 DIAGNOSIS — R5381 Other malaise: Principal | ICD-10-CM

## 2019-04-10 NOTE — Evaluation (Signed)
Occupational Therapy Assessment and Plan  Patient Details  Name: Willie Cisneros MRN: [id number] Date of Birth: 01-Jun-1926  OT Diagnosis: muscle weakness (generalized) Rehab Potential: Rehab Potential (ACUTE ONLY): Good ELOS: ~14 days   Today's Date: 04/10/2019 OT Individual Time: 1100-1200 OT Individual Time Calculation (min): 60 min     Problem List:  Patient Active Problem List   Diagnosis Date Noted  . Atrial fibrillation with RVR ([place]) 04/09/2019  . Debility 04/09/2019  . Pulmonary embolism ([place]) 04/03/2019    Past Medical History:  Past Medical History:  Diagnosis Date  . Glaucoma   . Kidney stones   . Medical history non-contributory   . Shingles   . TIA (transient ischemic attack)    Past Surgical History:  Past Surgical History:  Procedure Laterality Date  . NO PAST SURGERIES      Assessment & Plan Clinical Impression: Patient is a 86 y.o. year old male ht-handed male history of TIA as well as shingles on no prescription medications.  Per chart review lives with spouse independent prior to admission.  1 level home.  He also has a son in the area who can assist.  Presented 04/03/2019 with increasing shortness of breath and chest discomfort.  He recently had sustained a left foot strain for which she had a boot in place he tripped on the boot and fell March 7 without loss of consciousness.  He did strike the right side of his head and face.  Admission chemistries unremarkable, WBC 12,000, troponin high-sensitivity 369.  Cranial CT/MRI scan showed no evidence of intracranial abnormality.  CT cervical spine negative for fracture or dislocation however there was a nondisplaced left zygomatic arch fracture.  CT angiogram of the chest positive for pulmonary embolus with findings worrisome for right heart strain.  Peripheral wedge-shaped opacity in left lower lobe worrisome for pulmonary infarct.  MRI of the right knee showed focal radial tear at the posterior horn body junction  of the medial meniscus.  Probable nondisplaced horizontal tear component involving the posterior horn.  Popliteus tendon injury and muscle strain with moderate joint effusion.  Orthopedic services Dr. Ihor Gully follow-up with conservative care weightbearing as tolerated.  Cardiology service follow-up for evaluation of possible atrial fibrillation with RVR an echocardiogram ejection fraction 45%.  His elevated troponin felt to be related to demand ischemia.  Patient was started on intravenous heparin transition to Eliquis for both pulmonary emboli as well as atrial fibrillation.  He is tolerating a regular diet.  Patient transferred to CIR on 04/09/2019 .    Patient currently requires min to max A  with basic self-care skills and basic functional mobility secondary to muscle weakness, decreased cardiorespiratoy endurance and decreased standing balance and decreased balance strategies.  Prior to hospitalization, patient could complete ADL with modified independent .  Patient will benefit from skilled intervention to decrease level of assist with basic self-care skills and increase independence with basic self-care skills prior to discharge home with care partner.  Anticipate patient will require intermittent supervision and follow up home health.  OT - End of Session Activity Tolerance: Tolerates 30+ min activity with multiple rests Endurance Deficit: Yes Endurance Deficit Description: required rest breaks throughout session OT Assessment Rehab Potential (ACUTE ONLY): Good OT Patient demonstrates impairments in the following area(s): Balance;Edema;Endurance;Motor OT Basic ADL's Functional Problem(s): Grooming;Bathing;Dressing;Toileting OT Transfers Functional Problem(s): Toilet;Tub/Shower OT Additional Impairment(s): None OT Plan OT Intensity: Minimum of 1-2 x/day, 45 to 90 minutes OT Frequency: 5 out of 7 days OT  Duration/Estimated Length of Stay: ~14 days OT Treatment/Interventions: Balance/vestibular  training;Disease mangement/prevention;Self Care/advanced ADL retraining;Therapeutic Exercise;Cognitive remediation/compensation;DME/adaptive equipment instruction;Pain management;Skin care/wound managment;Wheelchair propulsion/positioning;UE/LE Strength taining/ROM;Community reintegration;Patient/family education;Splinting/orthotics;UE/LE Coordination activities;Discharge planning;Functional mobility training;Psychosocial support;Therapeutic Activities OT Self Feeding Anticipated Outcome(s): n/a OT Basic Self-Care Anticipated Outcome(s): setup OT Toileting Anticipated Outcome(s): mod I OT Bathroom Transfers Anticipated Outcome(s): mod I OT Recommendation Patient destination: Home Follow Up Recommendations: Home health OT Equipment Recommended: To be determined   Skilled Therapeutic Intervention Ot eval initiated with Ot with goals, purpose and role discussed.   Self care retraining at shower level. Pt received in w/c. Transferred from w/c to [facility] over the toilet with bilateral UE support on the RW with mod A. Pt able to come up into standing with mod A and cues for achieving upright posture. Pt able to perform hygiene but assistance for clothing management. With RW ambulated about ~ 6 feet to the shower stall and transition to tub bench with grab bars with min A at a very slow pace.  Pt with uncoordinated with post op boot from previous fall with injury. Pt able to shower and dry with min A. Pt dressed in the w/c. Pt requires frequent rest breaks in therapy and can present with labored breathing but can recover quickly. Pt required A to thread pants and pull up with focus on sit to stand and maintaining balance. TEDS, post op shoe and slipper with rubber sole donned with total A. Pt left resting up in w/c.    OT Evaluation Precautions/Restrictions  Precautions Precautions: Fall Precaution Comments: WBAT Required Braces or Orthoses: Other Brace Other Brace: post op boot on left  foot Restrictions Weight Bearing Restrictions: No General Chart Reviewed: Yes Family/Caregiver Present: No Vital Signs Therapy Vitals Pulse Rate: 75 BP: 111/75 Patient Position (if appropriate): Sitting Pain Pain Assessment Pain Scale: 0-10 Pain Score: 0-No pain Home Living/Prior Functioning Home Living Available Help at Discharge: Family, Available PRN/intermittently Type of Home: House Home Access: Stairs to enter [employer] of Steps: 2 from garage; has grab bars on either side Entrance Stairs-Rails: Right, Left, Can reach both Home Layout: One level Bathroom Shower/Tub: Multimedia programmer: Handicapped height Bathroom Accessibility: Yes Additional Comments: Pt reports he and his son plan to hire help to assist as needed upon discharge  Lives With: Spouse Prior Function Level of Independence: Independent with basic ADLs, Independent with gait, Independent with homemaking with ambulation, Independent with transfers  Able to Take Stairs?: Yes Driving: Yes 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 ADL ADL Grooming: Setup Upper Body Bathing: Minimal assistance Where Pensions consultant Bathing: Retail buyer Bathing: Minimal assistance Where Assessed-Lower Body Bathing: Administrator, sports Dressing: Minimal assistance Where Assessed-Upper Body Dressing: Wheelchair Lower Body Dressing: Maximal assistance Where Assessed-Lower Body Dressing: Wheelchair Toileting: Maximal assistance Where Assessed-Toileting: Toilet, Recruitment consultant Transfer: Moderate assistance Toilet Transfer Method: Stand pivot Science writer: Bedside commode, Energy manager: Minimal assistance Social research officer, government Method: Heritage manager: Radio broadcast assistant, Grab bars Vision Baseline Vision/History: No visual deficits Vision Assessment?: No apparent visual deficits Perception  Perception:  Within Functional Limits Praxis Praxis: Intact Cognition Overall Cognitive Status: Within Functional Limits for tasks assessed Arousal/Alertness: Awake/alert Orientation Level: Person;Place;Situation Person: Oriented Place: Oriented Situation: Oriented Year: 2021 Month: March Day of Week: Correct Memory: Appears intact Immediate Memory Recall: Sock;Blue;Bed Memory Recall Sock: Without Cue Memory Recall Blue: Without Cue Memory Recall Bed: Without Cue Attention: Selective Selective Attention: Appears intact Awareness:  Appears intact Problem Solving: Appears intact Safety/Judgment: Appears intact Sensation Sensation Light Touch: Appears Intact Hot/Cold: Appears Intact Proprioception: Appears Intact Additional Comments: WFL Coordination Gross Motor Movements are Fluid and Coordinated: No Fine Motor Movements are Fluid and Coordinated: Yes Coordination and Movement Description: grossly uncoordinated due to bilateral LE weakness and decreased postural alignment Finger Nose Finger Test: [facility] Motor  Motor Motor: Abnormal postural alignment and control Motor - Skilled Clinical Observations: grossly uncoordinated due to bilateral LE weakness and decreased postural control Mobility  Bed Mobility Bed Mobility: Rolling Right;Rolling Left;Supine to Sit Rolling Right: Contact Guard/Touching assist Rolling Left: Contact Guard/Touching assist Supine to Sit: Contact Guard/Touching assist Transfers Sit to Stand: Moderate Assistance - Patient 50-74% Stand to Sit: Moderate Assistance - Patient 50-74%  Trunk/Postural Assessment  Cervical Assessment Cervical Assessment: (flexed forward) Thoracic Assessment Thoracic Assessment: (kyphotic) Lumbar Assessment Lumbar Assessment: (posterior pelvic tilt) Postural Control Postural Control: Deficits on evaluation Postural Limitations: so flexed forward - difficulty coming upright with functional mobility  Balance Balance Balance Assessed:  Yes Static Sitting Balance Static Sitting - Balance Support: Bilateral upper extremity supported Static Sitting - Level of Assistance: 5: Stand by assistance Dynamic Sitting Balance Dynamic Sitting - Balance Support: Bilateral upper extremity supported Dynamic Sitting - Level of Assistance: 5: Stand by assistance Static Standing Balance Static Standing - Balance Support: No upper extremity supported Static Standing - Level of Assistance: 4: Min assist Dynamic Standing Balance Dynamic Standing - Balance Support: Bilateral upper extremity supported;During functional activity Dynamic Standing - Level of Assistance: 3: Mod assist Extremity/Trunk Assessment RUE Assessment General Strength Comments: reports had a RTC tear and unable to lift UE to 90 degrees and difficulty with shoulder adduction to reach other shoulder functionally (unable to wash or don deordant under left UE) strength WFL LUE Assessment LUE Assessment: Within Functional Limits     Refer to Care Plan for Long Term Goals  Recommendations for other services: None    Discharge Criteria: Patient will be discharged from OT if patient refuses treatment 3 consecutive times without medical reason, if treatment goals not met, if there is a change in medical status, if patient makes no progress towards goals or if patient is discharged from hospital.  The above assessment, treatment plan, treatment alternatives and goals were discussed and mutually agreed upon: by patient  Nicoletta Ba 04/10/2019, 12:51 PM

## 2019-04-10 NOTE — Progress Notes (Signed)
Physical Therapy Session Note  Patient Details  Name: Willie Cisneros MRN: [id number] Date of Birth: 07-08-1926  Today's Date: 04/10/2019 PT Individual Time: 1300-1414 PT Individual Time Calculation (min): 74 min   Short Term Goals: Week 1:  PT Short Term Goal 1 (Week 1): Pt will perform bed mobility with supervision PT Short Term Goal 2 (Week 1): Pt will perform bed<>chair transfer with LRAD min A PT Short Term Goal 3 (Week 1): Pt will perform car transfer with LRAD min A  Skilled Therapeutic Interventions/Progress Updates:   Received pt sitting in WC, pt agreeable to therapy, and denied any pain during session. Session focused on functional mobility/transfers, LE strength, dynamic standing balance/coordination, ambulation, stair navigation, and improved endurance with activity. Pt performed WC mobility 132ft using bilateral UE and supervision. Pt ambulated 75ft without AD mod A with 1 rail on R side. Pt with decreased stride length, wide BOS, and decreased cadence. Pt ambulated additional 29ft with RW min A. Pt required multiple rest breaks throughout session due to increased fatigue. Pt navigated 8 3in steps with 2 rails min A ascending and descending with a step to pattern. Pt required verbal cues for stepping sequence. Pt transferred stand<>pivot WC<>mat with RW min A. Pt performed standing horseshoe toss with RW CGA/min A x 2 trials. Pt transferred stand<>pivot mat<>WC with RW min A and was transported to dayroom in [facility] total assist. Pt transferred stand<>pivot WC<>Nustep with RW min A. Pt performed bilateral UE and LE strengthening on Nustep at workload 1 for 12 minutes for a total of 861 steps. Pt fatigued after activity and requested water. Stand<>pivot Nustep<>WC with RW min A. Pt transported back to room in [facility] total assist. Concluded session with pt sitting in WC, needs within reach, and seatbelt alarm on.   Therapy Documentation Precautions:  Restrictions Weight Bearing Restrictions:  No  Therapy/Group: Individual Therapy Martin Majestic PT, DPT   04/10/2019, 7:40 AM

## 2019-04-10 NOTE — Progress Notes (Signed)
[facility] PHYSICAL MEDICINE & REHABILITATION PROGRESS NOTE   Subjective/Complaints:  No breathing issues Good daily BM   ROS- neg CP. SOB, N/V/D  Objective:   No results found. Recent Labs    04/08/19 0513  WBC 8.6  HGB 12.7*  HCT 38.8*  PLT 144*   No results for input(s): NA, K, CL, CO2, GLUCOSE, BUN, CREATININE, CALCIUM in the last 72 hours.  Intake/Output Summary (Last 24 hours) at 04/10/2019 0759 Last data filed at 04/10/2019 0306 Gross per 24 hour  Intake --  Output 700 ml  Net -700 ml     Physical Exam: Vital Signs Blood pressure 103/73, pulse 96, temperature 98.9 F (37.2 C), resp. rate 17, height 5\' 10"  (1.778 m), weight 81.2 kg, SpO2 93 %.     Assessment/Plan: 1. Functional deficits secondary to debility  which require 3+ hours per day of interdisciplinary therapy in a comprehensive inpatient rehab setting.  Physiatrist is providing close team supervision and 24 hour management of active medical problems listed below.  Physiatrist and rehab team continue to assess barriers to discharge/monitor patient progress toward functional and medical goals  Care Tool:  Bathing              Bathing assist       Upper Body Dressing/Undressing Upper body dressing        Upper body assist      Lower Body Dressing/Undressing Lower body dressing            Lower body assist       Toileting Toileting    Toileting assist Assist for toileting: Independent with assistive device Assistive Device Comment: urinal   Transfers Chair/bed transfer  Transfers assist           Locomotion Ambulation   Ambulation assist              Walk 10 feet activity   Assist           Walk 50 feet activity   Assist           Walk 150 feet activity   Assist           Walk 10 feet on uneven surface  activity   Assist           Wheelchair     Assist               Wheelchair 50 feet with 2 turns  activity    Assist            Wheelchair 150 feet activity     Assist          Blood pressure 103/73, pulse 96, temperature 98.9 F (37.2 C), resp. rate 17, height 5\' 10"  (1.778 m), weight 81.2 kg, SpO2 93 %.    Medical Problem List and Plan: 1.  Debility with decreased functional mobility secondary to pulmonary emboli             -patient may shower             -ELOS/Goals: modI 7-10 days             -Admit to CIR; WBAT 2.  Antithrombotics: -DVT/anticoagulation:  Eliquis 10mg  BID             -antiplatelet therapy: N/A 3. Pain Management: Tylenol as needed, no issues  4. Mood: provide emotional support             -antipsychotic agents: N/A 5. Neuropsych: This  patient is capable of making decisions on his own behalf. 6. Skin/Wound Care: Routine skin checks 7. Fluids/Electrolytes/Nutrition: Routine in and outs with follow-up chemistries 8.  Atrial fibrillation.  Continue Eliquis as well as Lopressor 100 mg twice daily.  Cardiac rate controlled.  Follow-up cardiology services 9.  Recent fall 3/7 PTA Right knee focal radial tear at the posterior horn body junction of the medial meniscus.  Weightbearing as tolerated and follow-up orthopedic services Dr. Charlann Boxer 10.  Nondisplaced left zygomatic arch fracture.  Conservative care 11.  Left foot strain from recent fall.  Patient does wear a boot as needed 12. Elevated troponin 2/2 demand ischemia: Monitor for chest pain 13. Disposition: Lives with spouse who has dementia; he is her caretaker. Has a son in the area who can assist.   LOS: 1 days A FACE TO FACE EVALUATION WAS PERFORMED  Erick Colace 04/10/2019, 7:59 AM

## 2019-04-10 NOTE — Evaluation (Signed)
Physical Therapy Assessment and Plan  Patient Details  Name: Willie Cisneros MRN: [id number] Date of Birth: [birth year]  PT Diagnosis: Abnormal posture, Abnormality of gait, Difficulty walking and Muscle weakness Rehab Potential: Good ELOS: 14-18 days   Today's Date: 04/10/2019 PT Individual Time: 0757-0900 PT Individual Time Calculation (min): 63 min    Problem List:  Patient Active Problem List   Diagnosis Date Noted  . Atrial fibrillation with RVR ([place]) 04/09/2019  . Debility 04/09/2019  . Pulmonary embolism ([place]) 04/03/2019    Past Medical History:  Past Medical History:  Diagnosis Date  . Glaucoma   . Kidney stones   . Medical history non-contributory   . Shingles   . TIA (transient ischemic attack)    Past Surgical History:  Past Surgical History:  Procedure Laterality Date  . NO PAST SURGERIES      Assessment & Plan Clinical Impression: Patient is a 86 y.o. year old male with history of TIA as well as shingles on no prescription medications. Per chart review lives with spouse independent prior to admission. 1 level home.  He also has a son in the area who can assist.  Presented 04/03/2019 with increasing shortness of breath and chest discomfort.  He recently had sustained a left foot strain for which she had a boot in place he tripped on the boot and fell March 7 without loss of consciousness.  He did strike the right side of his head and face.  Admission chemistries unremarkable, WBC 12,000, troponin high-sensitivity 369.  Cranial CT/MRI scan showed no evidence of intracranial abnormality.  CT cervical spine negative for fracture or dislocation however there was a nondisplaced left zygomatic arch fracture.  CT angiogram of the chest positive for pulmonary embolus with findings worrisome for right heart strain.  Peripheral wedge-shaped opacity in left lower lobe worrisome for pulmonary infarct.  MRI of the right knee showed focal radial tear at the posterior horn body  junction of the medial meniscus.  Probable nondisplaced horizontal tear component involving the posterior horn.  Popliteus tendon injury and muscle strain with moderate joint effusion.  Orthopedic services Dr. Ihor Gully follow-up with conservative care weightbearing as tolerated.  Cardiology service follow-up for evaluation of possible atrial fibrillation with RVR an echocardiogram ejection fraction 45%.  His elevated troponin felt to be related to demand ischemia.  Patient was started on intravenous heparin transition to Eliquis for both pulmonary emboli as well as atrial fibrillation.  He is tolerating a regular diet.  Therapy evaluations completed and patient is to be admitted for a comprehensive rehab program on 04/09/19.  Patient currently requires max with mobility secondary to muscle weakness and decreased standing balance, decreased postural control and decreased balance strategies.  Prior to hospitalization, patient was independent  with mobility and lived with Spouse in a House home.  Home access is 2 from garage; has grab bars on either sideStairs to enter.  Patient will benefit from skilled PT intervention to maximize safe functional mobility, minimize fall risk and decrease caregiver burden for planned discharge home with intermittent assist.  Anticipate patient will benefit from follow up [facility] at discharge.  PT - End of Session Activity Tolerance: Tolerates 30+ min activity with multiple rests Endurance Deficit: Yes Endurance Deficit Description: required rest breaks throughout session PT Assessment Rehab Potential (ACUTE/IP ONLY): Good PT Barriers to Discharge: Decreased caregiver support;Home environment access/layout PT Barriers to Discharge Comments: primary caregiver for wife who has dementia, 2 STE PT Patient demonstrates impairments in the following area(s):  Balance;Endurance;Motor PT Transfers Functional Problem(s): Bed Mobility;Bed to Chair;Car;Furniture PT Locomotion Functional  Problem(s): Ambulation;Wheelchair Mobility;Stairs PT Plan PT Intensity: Minimum of 1-2 x/day ,45 to 90 minutes PT Frequency: 5 out of 7 days PT Duration Estimated Length of Stay: 14-18 days PT Treatment/Interventions: Ambulation/gait training;Discharge planning;Functional mobility training;Psychosocial support;Therapeutic Activities;Balance/vestibular training;Disease management/prevention;Neuromuscular re-education;Skin care/wound management;Therapeutic Exercise;Wheelchair propulsion/positioning;DME/adaptive equipment instruction;Pain management;Splinting/orthotics;UE/LE Strength taining/ROM;Community reintegration;Functional electrical stimulation;Patient/family education;Stair training;UE/LE Coordination activities PT Transfers Anticipated Outcome(s): Mod I with LRAD PT Locomotion Anticipated Outcome(s): Supervision with LRAD PT Recommendation Follow Up Recommendations: Home health PT Patient destination: Home Equipment Recommended: To be determined  Skilled Therapeutic Intervention Evaluation completed (see details above and below) with education on PT POC and goals and individual treatment initiated with focus on functional mobility/transfers, LE strength, dynamic standing balance, ambulation, simulated car transfers, and improved endurance with activity. Received pt supine in bed, pt educated on PT evaluation, CIR policies, and therapy schedule, and agreeable. Pt reported urge to have BM and transferred supine<>sitting EOB with CGA and increased time with HOB elevated. Pt transferred bed<>bedside commode without AD max A with cues for turning sequence and stepping. Pt required mod A to pull underwear down. Pt continent of bowel and NT made aware. Pt able to perform hygiene management with supervision. Pt donned pull over shift with mod A and incontinence brief and pants mod A. Pt transferred sit<>stand mod A and required max A to pull pants and brief over hips. Upon returning to bed, pt with  posterior LOB requiring max A to correct, otherwise pt able to maintain static sitting balance with supervision. Pt transferred bed<>WC without AD max A without AD. Donned L darco shoe total assist. Pt washed hands seated in WC with supervision. Pt performed WC mobility 144f using bilateral UEs and supervision. Pt performed simulated car transfer without AD max A with verbal cues for turning technique and safety. Pt transported back to room in [facility]. Concluded session with pt sitting in WC, needs within reach, and seatbelt alarm on. Safety plan updated.   PT Evaluation Precautions/Restrictions Precautions Precautions: Fall Precaution Comments: WBAT Restrictions Weight Bearing Restrictions: No Home Living/Prior Functioning Home Living Available Help at Discharge: Family;Available PRN/intermittently Type of Home: House Home Access: Stairs to enter ECenterPoint Energyof Steps: 2 from garage; has grab bars on either side Entrance Stairs-Rails: Right;Left;Can reach both Home Layout: One level Bathroom Shower/Tub: WMultimedia programmer Handicapped height Bathroom Accessibility: Yes Additional Comments: Pt reports he and his son plan to hire help to assist as needed upon discharge  Lives With: Spouse Prior Function Level of Independence: Independent with basic ADLs;Independent with gait;Independent with homemaking with ambulation;Independent with transfers  Able to Take Stairs?: Yes Driving: Yes 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 sWater quality scientistOverall Cognitive Status: Within FAdvertising copywriterfor tasks assessed Arousal/Alertness: Awake/alert Orientation Level: Oriented X4 Memory: Appears intact Awareness: Appears intact Problem Solving: Appears intact Safety/Judgment: Appears intact Sensation Sensation Light Touch: Appears Intact Proprioception: Appears Intact Additional Comments: WFL Coordination Gross Motor Movements are Fluid and  Coordinated: No Fine Motor Movements are Fluid and Coordinated: Yes Coordination and Movement Description: grossly uncoordinated due to bilateral LE weakness and decreased postural alignment Finger Nose Finger Test: WHalifax Health Medical CenterHeel Shin Test: slow but WFL Motor  Motor Motor: Abnormal postural alignment and control Motor - Skilled Clinical Observations: grossly uncoordinated due to bilateral LE weakness and decreased postural control  Mobility Bed Mobility Bed Mobility: Rolling Right;Rolling Left;Supine to Sit Rolling Right: Contact Guard/Touching assist Rolling  Left: Contact Guard/Touching assist Supine to Sit: Contact Guard/Touching assist Transfers Transfers: Sit to Stand;Stand to Sit;Stand Pivot Transfers Sit to Stand: Moderate Assistance - Patient 50-74% Stand to Sit: Moderate Assistance - Patient 50-74% Stand Pivot Transfers: Maximal Assistance - Patient 25 - 49% Stand Pivot Transfer Details: Verbal cues for sequencing;Verbal cues for technique Stand Pivot Transfer Details (indicate cue type and reason): verbal cues for step sequence and turning technique for safety Transfer (Assistive device): None Locomotion  Gait Ambulation: Yes Gait Assistance: Moderate Assistance - Patient 50-74% Gait Distance (Feet): 7 Feet Assistive device: Other (Comment)(rail on R) Gait Assistance Details: Verbal cues for technique Gait Assistance Details: verbal cues to increase BOS Gait Gait: Yes Gait Pattern: Impaired Gait Pattern: Decreased trunk rotation;Wide base of support;Decreased stride length;Decreased step length - right;Decreased step length - left;Trunk flexed;Poor foot clearance - left;Poor foot clearance - right Gait velocity: decreased Stairs / Additional Locomotion Stairs: Yes Stairs Assistance: Minimal Assistance - Patient > 75% Stair Management Technique: Two rails Number of Stairs: 8 Height of Stairs: 3 Wheelchair Mobility Wheelchair Mobility: Yes Wheelchair Assistance:  Chartered loss adjuster: Both upper extremities Wheelchair Parts Management: Needs assistance Distance: 137f  Trunk/Postural Assessment  Cervical Assessment Cervical Assessment: Exceptions to WFL(rounded shoulders) Thoracic Assessment Thoracic Assessment: Exceptions to WFL(kyphosis) Lumbar Assessment Lumbar Assessment: Exceptions to WFL(posterior pelvic tilt) Postural Control Postural Control: Deficits on evaluation  Balance Balance Balance Assessed: Yes Static Sitting Balance Static Sitting - Balance Support: Bilateral upper extremity supported Static Sitting - Level of Assistance: 5: Stand by assistance Dynamic Sitting Balance Dynamic Sitting - Balance Support: Bilateral upper extremity supported Dynamic Sitting - Level of Assistance: 5: Stand by assistance Static Standing Balance Static Standing - Balance Support: No upper extremity supported Static Standing - Level of Assistance: 4: Min assist Dynamic Standing Balance Dynamic Standing - Balance Support: Bilateral upper extremity supported;During functional activity Dynamic Standing - Level of Assistance: 3: Mod assist Extremity Assessment  RLE Assessment RLE Assessment: Exceptions to WJohns Hopkins ScsGeneral Strength Comments: grossly generalized to 4-/5 (except DF 3+/5) LLE Assessment LLE Assessment: Exceptions to WSur[facility] Special[facility] Hospitals Of America Southeast HoustonGeneral Strength Comments: grossly generalized to 4-/5 (except DF 3+/5)  Refer to Care Plan for Long Term Goals  Recommendations for other services: None   Discharge Criteria: Patient will be discharged from PT if patient refuses treatment 3 consecutive times without medical reason, if treatment goals not met, if there is a change in medical status, if patient makes no progress towards goals or if patient is discharged from hospital.  The above assessment, treatment plan, treatment alternatives and goals were discussed and mutually agreed upon: by patient  AAlfonse AlpersPT,  DPT  04/10/2019, 7:30 AM

## 2019-04-11 NOTE — Plan of Care (Signed)
  Problem: Consults Goal: RH GENERAL PATIENT EDUCATION Description: See Patient Education module for education specifics. Outcome: Progressing   Problem: RH SAFETY Goal: RH STG ADHERE TO SAFETY PRECAUTIONS W/ASSISTANCE/DEVICE Description: STG Adhere to Safety Precautions With Mod I Assistance/Device. Outcome: Progressing   Problem: RH PAIN MANAGEMENT Goal: RH STG PAIN MANAGED AT OR BELOW PT'S PAIN GOAL Description: Less than 4  Outcome: Progressing

## 2019-04-11 NOTE — Progress Notes (Signed)
[place] PHYSICAL MEDICINE & REHABILITATION PROGRESS NOTE   Subjective/Complaints:  No issues overnite, some leg swelling noted in hospital but not at home , denies eating a lot of salt but likes pork sausage in am   ROS- neg CP. SOB, N/V/D  Objective:   No results found. No results for input(s): WBC, HGB, HCT, PLT in the last 72 hours. No results for input(s): NA, K, CL, CO2, GLUCOSE, BUN, CREATININE, CALCIUM in the last 72 hours.  Intake/Output Summary (Last 24 hours) at 04/11/2019 0856 Last data filed at 04/11/2019 0534 Gross per 24 hour  Intake 360 ml  Output 800 ml  Net -440 ml     Physical Exam: Vital Signs Blood pressure 104/83, pulse 69, temperature 97.8 F (36.6 C), resp. rate 17, height 5\' 10"  (1.778 m), weight 81.2 kg, SpO2 98 %.     General: No acute distress Mood and affect are appropriate Heart: Regular rate and rhythm no rubs murmurs or extra sounds Lungs: Clear to auscultation, breathing unlabored, no rales or wheezes Abdomen: Positive bowel sounds, soft nontender to palpation, nondistended Extremities: No clubbing, cyanosis, or edema Skin: No evidence of breakdown, no evidence of rash Neurologic: Cranial nerves II through XII intact, motor strength is 5/5 in bilateral deltoid, bicep, tricep, grip, hip flexor, knee extensors, ankle dorsiflexor and plantar flexor  Ext 1+ pre tib and 2+ pedal edema BLE  Musculoskeletal: Full range of motion in all 4 extremities. No joint swelling   Assessment/Plan: 1. Functional deficits secondary to debility  which require 3+ hours per day of interdisciplinary therapy in a comprehensive inpatient rehab setting.  Physiatrist is providing close team supervision and 24 hour management of active medical problems listed below.  Physiatrist and rehab team continue to assess barriers to discharge/monitor patient progress toward functional and medical goals  Care Tool:  Bathing    Body parts bathed by patient: Right arm,  Chest, Abdomen, Front perineal area, Right upper leg, Left upper leg, Right lower leg, Left lower leg, Face   Body parts bathed by helper: Left arm, Buttocks     Bathing assist Assist Level: Minimal Assistance - Patient > 75%     Upper Body Dressing/Undressing Upper body dressing   What is the patient wearing?: Pull over shirt    Upper body assist Assist Level: Independent    Lower Body Dressing/Undressing Lower body dressing      What is the patient wearing?: Incontinence brief, Pants     Lower body assist Assist for lower body dressing: Minimal Assistance - Patient > 75%     Toileting Toileting    Toileting assist Assist for toileting: Maximal Assistance - Patient 25 - 49% Assistive Device Comment: urinal   Transfers Chair/bed transfer  Transfers assist     Chair/bed transfer assist level: Moderate Assistance - Patient 50 - 74%     Locomotion Ambulation   Ambulation assist      Assist level: Moderate Assistance - Patient 50 - 74% Assistive device: Other (comment)(Rail on R) Max distance: 51ft   Walk 10 feet activity   Assist  Walk 10 feet activity did not occur: Safety/medical concerns(bilateral LE weakness, decreased postural control)        Walk 50 feet activity   Assist Walk 50 feet with 2 turns activity did not occur: Safety/medical concerns(fatigue, bilateral LE weakness, decreased postural control)         Walk 150 feet activity   Assist Walk 150 feet activity did not occur: Safety/medical concerns(fatigue,  bilateral LE weakness, decreased postural control)         Walk 10 feet on uneven surface  activity   Assist Walk 10 feet on uneven surfaces activity did not occur: Safety/medical concerns(fatigue, bilateral LE weakness, decreased postural control)         Wheelchair     Assist Will patient use wheelchair at discharge?: Yes Type of Wheelchair: Manual    Wheelchair assist level: Supervision/Verbal cueing Max  wheelchair distance: 176ft    Wheelchair 50 feet with 2 turns activity    Assist        Assist Level: Supervision/Verbal cueing   Wheelchair 150 feet activity     Assist      Assist Level: Maximal Assistance - Patient 25 - 49%   Blood pressure 104/83, pulse 69, temperature 97.8 F (36.6 C), resp. rate 17, height 5\' 10"  (1.778 m), weight 81.2 kg, SpO2 98 %.    Medical Problem List and Plan: 1.  Debility with decreased functional mobility secondary to pulmonary emboli             -patient may shower             -ELOS/Goals: modI 7-10 days             -Admit to CIR; WBAT 2.  Antithrombotics: -DVT/anticoagulation:  Eliquis 10mg  BID             -antiplatelet therapy: N/A 3. Pain Management: Tylenol as needed, no issues  4. Mood: provide emotional support             -antipsychotic agents: N/A 5. Neuropsych: This patient is capable of making decisions on his own behalf. 6. Skin/Wound Care: Routine skin checks 7. Fluids/Electrolytes/Nutrition: Routine in and outs with follow-up chemistries 8.  Atrial fibrillation.  Continue Eliquis as well as Lopressor 100 mg twice daily.  Cardiac rate controlled.  Follow-up cardiology services Vitals:   04/10/19 1934 04/11/19 0405  BP: (!) 144/78 104/83  Pulse: 100 69  Resp: 18 17  Temp: 98.2 F (36.8 C) 97.8 F (36.6 C)  SpO2: 99% 98%  Rate controlled  9.  Recent fall 3/7 PTA Right knee focal radial tear at the posterior horn body junction of the medial meniscus.  Weightbearing as tolerated and follow-up orthopedic services Dr. 04/13/19 10.  Nondisplaced left zygomatic arch fracture.  Conservative care 11.  Left foot strain from recent fall.  Patient does wear a boot as needed 12. Elevated troponin 2/2 demand ischemia: Monitor for chest pain 13. Disposition: Lives with spouse who has dementia; he is her caretaker. Has a son in the area who can assist.  14.  BLE edema, lung exam nl, likely venous stasis , cont TEDs and add 4gm Na+  restriction  LOS: 2 days A FACE TO FACE EVALUATION WAS PERFORMED  5/7 04/11/2019, 8:56 AM

## 2019-04-12 ENCOUNTER — Inpatient Hospital Stay (HOSPITAL_COMMUNITY): Payer: Medicare Other | Admitting: Occupational Therapy

## 2019-04-12 ENCOUNTER — Inpatient Hospital Stay (HOSPITAL_COMMUNITY): Payer: Medicare Other

## 2019-04-12 DIAGNOSIS — I4891 Unspecified atrial fibrillation: Secondary | ICD-10-CM

## 2019-04-12 DIAGNOSIS — T1490XA Injury, unspecified, initial encounter: Secondary | ICD-10-CM

## 2019-04-12 DIAGNOSIS — I480 Paroxysmal atrial fibrillation: Secondary | ICD-10-CM

## 2019-04-12 LAB — CBC WITH DIFFERENTIAL/PLATELET
Abs Immature Granulocytes: 0.16 10*3/uL — ABNORMAL HIGH (ref 0.00–0.07)
Basophils Absolute: 0 10*3/uL (ref 0.0–0.1)
Basophils Relative: 0 %
Eosinophils Absolute: 0.2 10*3/uL (ref 0.0–0.5)
Eosinophils Relative: 2 %
HCT: 37.7 % — ABNORMAL LOW (ref 39.0–52.0)
Hemoglobin: 12.1 g/dL — ABNORMAL LOW (ref 13.0–17.0)
Immature Granulocytes: 2 %
Lymphocytes Relative: 22 %
Lymphs Abs: 2.1 10*3/uL (ref 0.7–4.0)
MCH: 29.6 pg (ref 26.0–34.0)
MCHC: 32.1 g/dL (ref 30.0–36.0)
MCV: 92.2 fL (ref 80.0–100.0)
Monocytes Absolute: 0.8 10*3/uL (ref 0.1–1.0)
Monocytes Relative: 8 %
Neutro Abs: 6.2 10*3/uL (ref 1.7–7.7)
Neutrophils Relative %: 66 %
Platelets: 312 10*3/uL (ref 150–400)
RBC: 4.09 MIL/uL — ABNORMAL LOW (ref 4.22–5.81)
RDW: 13.2 % (ref 11.5–15.5)
WBC: 9.4 10*3/uL (ref 4.0–10.5)
nRBC: 0 % (ref 0.0–0.2)

## 2019-04-12 LAB — COMPREHENSIVE METABOLIC PANEL
ALT: 30 U/L (ref 0–44)
AST: 23 U/L (ref 15–41)
Albumin: 2.6 g/dL — ABNORMAL LOW (ref 3.5–5.0)
Alkaline Phosphatase: 62 U/L (ref 38–126)
Anion gap: 11 (ref 5–15)
BUN: 17 mg/dL (ref 8–23)
CO2: 26 mmol/L (ref 22–32)
Calcium: 8.8 mg/dL — ABNORMAL LOW (ref 8.9–10.3)
Chloride: 102 mmol/L (ref 98–111)
Creatinine, Ser: 1.02 mg/dL (ref 0.61–1.24)
GFR calc Af Amer: >60 mL/min (ref >60)
GFR calc non Af Amer: >60 mL/min (ref >60)
Glucose, Bld: 114 mg/dL — ABNORMAL HIGH (ref 70–99)
Potassium: 3.8 mmol/L (ref 3.5–5.1)
Sodium: 139 mmol/L (ref 135–145)
Total Bilirubin: 0.8 mg/dL (ref 0.3–1.2)
Total Protein: 6.4 g/dL — ABNORMAL LOW (ref 6.5–8.1)

## 2019-04-12 NOTE — Progress Notes (Signed)
Physical Therapy Session Note  Patient Details  Name: Willie Cisneros MRN: [id number] Date of Birth: 08-Apr-1926  Today's Date: 04/12/2019 PT Individual Time: 9163-8466 PT Individual Time Calculation (min): 42 min   Short Term Goals: Week 1:  PT Short Term Goal 1 (Week 1): Pt will perform bed mobility with supervision PT Short Term Goal 2 (Week 1): Pt will perform bed<>chair transfer with LRAD min A PT Short Term Goal 3 (Week 1): Pt will perform car transfer with LRAD min A  Skilled Therapeutic Interventions/Progress Updates:   Received pt sitting in WC, pt agreeable to therapy, and denied any pain during session. Session focused on functional mobility/transfers, LE strength, dynamic standing balance/coordination, ambulation, and improved endurance with activity. Pt performed WC mobility 124ft using bilateral UEs and supervision to therapy gym. Pt ambulated 155ft with RW min A/CGA with increased time due to cadence. Pt demonstrates decreased bilateral stride length, flexed trunk, and decreased L LE foot clearance requiring min verbal cues to correct. Pt required multiple rest breaks throughout session due to increased fatigue. Discussed setup for home health therapy upon discharge and pt agreeable. Educated pt on LOS and therapy schedule while in hospital. Pt transferred sit<>stand with bilateral UE support x 10 reps with CGA/min A. Pt transported back to room in [facility] total assist. Concluded session with pt sitting in WC, needs within reach, and chair pad alarm on.   Therapy Documentation Precautions:  Precautions Precautions: Fall Precaution Comments: WBAT Required Braces or Orthoses: Other Brace Other Brace: post op boot on left foot Restrictions Weight Bearing Restrictions: No  Therapy/Group: Individual Therapy Martin Majestic PT, DPT   04/12/2019, 7:32 AM

## 2019-04-12 NOTE — Progress Notes (Signed)
ANTICOAGULATION CONSULT NOTE  Pharmacy Consult for Eliquis Indication: acute pulmonary embolus; Atrial fibrillation  Labs: Recent Labs    04/12/19 0525  HGB 12.1*  HCT 37.7*  PLT 312  CREATININE 1.02    Estimated Creatinine Clearance: 48.7 mL/min (by C-G formula based on SCr of 1.02 mg/dL).   Assessment: 89 yom who presented to the ED on 3/13 with c/o CP and SOB. Chest CTA showed acute PE with concern for right heart strain. He was transitioned from heparin drip to Eliquis on 3/17 for PE and afib.  - s/p right knee aspiration on 3/16  Today, 04/12/2019: - CBC stable - no bleeding documented  Plan:  - Continue Eliquis 10 mg PO bid x7 days, then 5 mg PO bid (to start on 3/24) - Pharmacy signing off but will continue to follow peripherally - please re-consult if needed  Thank you for involving pharmacy in this patient's care.  Loura Back, PharmD, BCPS Clinical Pharmacist Clinical phone for 04/12/2019 until 3p is [phone] 04/12/2019 12:24 PM  **Pharmacist phone directory can be found on [email or web address] listed under [facility]**

## 2019-04-12 NOTE — Progress Notes (Signed)
PHYSICAL MEDICINE & REHABILITATION PROGRESS NOTE   Subjective/Complaints: Patient seen sitting up in bed this AM.  He states he slept well overnight.  Significant time spent with patient discussing medical issues with patient and wife.   ROS: Denies CP. SOB, N/V/D  Objective:   No results found. Recent Labs    04/12/19 0525  WBC 9.4  HGB 12.1*  HCT 37.7*  PLT 312   Recent Labs    04/12/19 0525  NA 139  K 3.8  CL 102  CO2 26  GLUCOSE 114*  BUN 17  CREATININE 1.02  CALCIUM 8.8*    Intake/Output Summary (Last 24 hours) at 04/12/2019 1054 Last data filed at 04/12/2019 0520 Gross per 24 hour  Intake 120 ml  Output 750 ml  Net -630 ml     Physical Exam: Vital Signs Blood pressure 114/77, pulse 85, temperature 98.3 F (36.8 C), resp. rate 16, height 5\' 10"  (1.778 m), weight 81.2 kg, SpO2 96 %. Constitutional: No distress . Vital signs reviewed. HENT: Facial ecchymosis Eyes: EOMI. No discharge. Cardiovascular: No JVD. Respiratory: Normal effort.  No stridor. GI: Non-distended. Skin: Warm and dry.  Intact. Psych: Normal mood.  Normal behavior. Musc: No edema in extremities.  No tenderness in extremities. Neurologic: Alert Motor: 4+-5/5 throughout  Assessment/Plan: 1. Functional deficits secondary to debility  which require 3+ hours per day of interdisciplinary therapy in a comprehensive inpatient rehab setting.  Physiatrist is providing close team supervision and 24 hour management of active medical problems listed below.  Physiatrist and rehab team continue to assess barriers to discharge/monitor patient progress toward functional and medical goals  Care Tool:  Bathing    Body parts bathed by patient: Right arm, Chest, Abdomen, Front perineal area, Right upper leg, Left upper leg, Right lower leg, Left lower leg, Face   Body parts bathed by helper: Left arm, Buttocks     Bathing assist Assist Level: Minimal Assistance - Patient > 75%      Upper Body Dressing/Undressing Upper body dressing   What is the patient wearing?: Pull over shirt    Upper body assist Assist Level: Independent    Lower Body Dressing/Undressing Lower body dressing      What is the patient wearing?: Incontinence brief, Pants     Lower body assist Assist for lower body dressing: Minimal Assistance - Patient > 75%     Toileting Toileting    Toileting assist Assist for toileting: Minimal Assistance - Patient > 75% Assistive Device Comment: urinal   Transfers Chair/bed transfer  Transfers assist     Chair/bed transfer assist level: Moderate Assistance - Patient 50 - 74%     Locomotion Ambulation   Ambulation assist      Assist level: Moderate Assistance - Patient 50 - 74% Assistive device: Other (comment)(Rail on R) Max distance: 60ft   Walk 10 feet activity   Assist  Walk 10 feet activity did not occur: Safety/medical concerns(bilateral LE weakness, decreased postural control)        Walk 50 feet activity   Assist Walk 50 feet with 2 turns activity did not occur: Safety/medical concerns(fatigue, bilateral LE weakness, decreased postural control)         Walk 150 feet activity   Assist Walk 150 feet activity did not occur: Safety/medical concerns(fatigue, bilateral LE weakness, decreased postural control)         Walk 10 feet on uneven surface  activity   Assist Walk 10 feet on uneven surfaces activity  did not occur: Safety/medical concerns(fatigue, bilateral LE weakness, decreased postural control)         Wheelchair     Assist Will patient use wheelchair at discharge?: Yes Type of Wheelchair: Manual    Wheelchair assist level: Supervision/Verbal cueing Max wheelchair distance: 125ft    Wheelchair 50 feet with 2 turns activity    Assist        Assist Level: Supervision/Verbal cueing   Wheelchair 150 feet activity     Assist      Assist Level: Maximal Assistance - Patient  25 - 49%   Medical Problem List and Plan: 1.  Debility with decreased functional mobility secondary to pulmonary emboli  Cont CIR  Patient would like to discharge ASAP to be with his wife, significant time spent in discussion regarding discharge, safety, and medical stability 2.  Antithrombotics: -DVT/anticoagulation:  Cont Eliquis 10mg  BID             -antiplatelet therapy: N/A 3. Pain Management: Tylenol as needed  Controlled on 3/22 4. Mood: provide emotional support             -antipsychotic agents: N/A 5. Neuropsych: This patient is capable of making decisions on his own behalf. 6. Skin/Wound Care: Routine skin checks 7. Fluids/Electrolytes/Nutrition: Routine in and outs  8.  Atrial fibrillation.  Continue Eliquis as well as Lopressor 100 mg twice daily.  Cardiac rate controlled.  Follow-up cardiology services Vitals:   04/12/19 0523 04/12/19 0817  BP: 109/71 114/77  Pulse: 83 85  Resp: 16   Temp: 98.3 F (36.8 C)   SpO2: 96%    Rate controlled on 3/22 9.  Recent fall 3/7 PTA Right knee focal radial tear at the posterior horn body junction of the medial meniscus.  Weightbearing as tolerated and follow-up orthopedic services Dr. Alvan Dame 10.  Nondisplaced left zygomatic arch fracture.  Conservative care 11.  Left foot strain from recent fall.  Patient does wear a boot as needed 12. Elevated troponin 2/2 demand ischemia.   Monitor for chest pain  LOS: 3 days A FACE TO FACE EVALUATION WAS PERFORMED  Willie Cisneros Willie Cisneros 04/12/2019, 10:54 AM

## 2019-04-12 NOTE — Care Management (Signed)
   Patient Details  Name: Willie Cisneros MRN: [id number] Date of Birth: July 13, 1926  Today's Date: 04/12/2019  Problem List:  Patient Active Problem List   Diagnosis Date Noted  . Atrial fibrillation (HCC)   . Trauma   . Atrial fibrillation with RVR (HCC) 04/09/2019  . Debility 04/09/2019  . Pulmonary embolism (HCC) 04/03/2019   Past Medical History:  Past Medical History:  Diagnosis Date  . Glaucoma   . Kidney stones   . Medical history non-contributory   . Shingles   . TIA (transient ischemic attack)    Past Surgical History:  Past Surgical History:  Procedure Laterality Date  . NO PAST SURGERIES     Social History:  reports that he has never smoked. He has never used smokeless tobacco. He reports previous alcohol use. He reports that he does not use drugs.  Family / Support Systems Marital Status: Married Patient Roles: Spouse, Parent, Caregiver Spouse/Significant Other: Wife Children: Son Armed forces training and education officer Other Supports: Neighbors Anticipated Caregiver: [place] and neighbor Ability/Limitations of Caregiver: intermittant supervisoin  Caregiver Availability: Intermittent  Social History Preferred language: English Religion:  Read: Yes Write: Yes Employment Status: Retired Date Retired/Disabled/Unemployed: Sales   Abuse/Neglect Abuse/Neglect Assessment Can Be Completed: Yes Physical Abuse: Denies Verbal Abuse: Denies Sexual Abuse: Denies Exploitation of patient/patient's resources: Denies Self-Neglect: Denies  Emotional Status Pt's affect, behavior and adjustment status: Normal affect, behavior and mood Recent Psychosocial Issues: Insomnia  Patient / Family Perceptions, Expectations & Goals Pt/Family understanding of illness & functional limitations: Patient is worried he will not be able to resume being a caregiver for his wife Premorbid pt/family roles/activities: Independent PTA except for limited driving and reports RTC tear and unable to lift UE to 90 degrees  and difficulty with shoulder adduction to reach other shoulder functionally (per OT;unable to wash or don deordant under left UE Anticipated changes in roles/activities/participation: May need assistance at discharge Pt/family expectations/goals: Return home and be a caregiver for his wife  Building surveyor: None Premorbid Home Care/DME Agencies: None Transportation available at discharge: Son will provide transportation at discharge  Discharge Planning Living Arrangements: Spouse/significant other Support Systems: Spouse/significant other, Children, Friends/neighbors Type of Residence: Private residence Insurance Resources: [employer] Screen Referred: No Money Management: Patient Does the patient have any problems obtaining your medications?: No Home Management: Family and friends will help patient manage the home Patient/Family Preliminary Plans: Patient to return home and son and neighbors will check in [place] Barriers to Discharge: Other (comments)(Provides supervision for his wife) Social Work Anticipated Follow Up Needs: HH/OP Expected length of stay: 10-14 days  Clinical Impression Very pleasant gentleman, "on top of his health" and also a caregiver for his wife (dementia). Reports he wants to regain pre-admission functional level and discharge within 10 days if at all possible. He has help for transportation at discharge however requested list of facilities that could provide private duty personal care services.    Chana Bode B 04/12/2019, 2:50 PM

## 2019-04-12 NOTE — Plan of Care (Signed)
  Problem: Consults Goal: RH GENERAL PATIENT EDUCATION Description: See Patient Education module for education specifics. 04/12/2019 1250 by Maryclare Bean, RN Outcome: Progressing 04/12/2019 1250 by Maryclare Bean, RN Outcome: Progressing   Problem: RH BOWEL ELIMINATION Goal: RH STG MANAGE BOWEL WITH ASSISTANCE Description: STG Manage Bowel with Mod I Assistance. Outcome: Progressing   Problem: RH SAFETY Goal: RH STG ADHERE TO SAFETY PRECAUTIONS W/ASSISTANCE/DEVICE Description: STG Adhere to Safety Precautions With Mod I Assistance/Device. Outcome: Progressing   Problem: RH PAIN MANAGEMENT Goal: RH STG PAIN MANAGED AT OR BELOW PT'S PAIN GOAL Description: Less than 4  Outcome: Progressing

## 2019-04-12 NOTE — IPOC Note (Signed)
Individualized overall Plan of Care [facility]) Patient Details Name: Willie Cisneros MRN: [id number] DOB: Mar 31, 1926  Admitting Diagnosis: Debility  Hospital Problems: Principal Problem:   Debility Active Problems:   Atrial fibrillation [facility])   Trauma     Functional Problem List: Nursing Bladder, Bowel, Edema, Endurance, Nutrition, Pain, Perception, Safety  PT Balance, Endurance, Motor  OT Balance, Edema, Endurance, Motor  SLP    TR         Basic ADL's: OT Grooming, Bathing, Dressing, Toileting     Advanced  ADL's: OT       Transfers: PT Bed Mobility, Bed to Chair, Car, Occupational psychologist, Research scientist (life sciences): PT Ambulation, Psychologist, prison and probation services, Stairs     Additional Impairments: OT None  SLP        TR      Anticipated Outcomes Item Anticipated Outcome  Self Feeding n/a  Swallowing      Basic self-care  setup  Toileting  mod I   Bathroom Transfers mod I  Bowel/Bladder  manage bowel and bladder with mod I assist  Transfers  Mod I with LRAD  Locomotion  Supervision with LRAD  Communication     Cognition     Pain  Pain level less than 4 on scale of 0-10  Safety/Judgment  remain free of injury, prevent falls with mod I assit   Therapy Plan: PT Intensity: Minimum of 1-2 x/day ,45 to 90 minutes PT Frequency: 5 out of 7 days PT Duration Estimated Length of Stay: 14-18 days OT Intensity: Minimum of 1-2 x/day, 45 to 90 minutes OT Frequency: 5 out of 7 days OT Duration/Estimated Length of Stay: ~14 days      Team Interventions: Nursing Interventions Patient/Family Education, Pain Management, Bladder Management, Medication Management, Bowel Management, Skin Care/Wound Management, Psychosocial Support, Disease Management/Prevention, Discharge Planning, Cognitive Remediation/Compensation  PT interventions Ambulation/gait training, Discharge planning, Functional mobility training, Psychosocial support, Therapeutic Activities, Balance/vestibular  training, Disease management/prevention, Neuromuscular re-education, Skin care/wound management, Therapeutic Exercise, Wheelchair propulsion/positioning, DME/adaptive equipment instruction, Pain management, Splinting/orthotics, UE/LE Strength taining/ROM, Community reintegration, Development worker, international aid stimulation, Patient/family education, Museum/gallery curator, UE/LE Coordination activities  OT Interventions Warden/ranger, Disease mangement/prevention, Self Care/advanced ADL retraining, Therapeutic Exercise, Cognitive remediation/compensation, DME/adaptive equipment instruction, Pain management, Skin care/wound managment, Wheelchair propulsion/positioning, UE/LE Strength taining/ROM, Firefighter, Equities trader education, Splinting/orthotics, UE/LE Coordination activities, Discharge planning, Functional mobility training, Psychosocial support, Therapeutic Activities  SLP Interventions    TR Interventions    SW/CM Interventions Discharge Planning, Patient/Family Education, Psychosocial Support, Disease Management/Prevention   Barriers to Discharge MD  Medical stability, Lack of/limited family support, and patient would like to leave ASAP  Nursing      PT Decreased caregiver support, Home environment access/layout primary caregiver for wife who has dementia, 2 STE  OT      SLP      SW Decreased caregiver support     Team Discharge Planning: Destination: PT-Home ,OT- Home , SLP-  Projected Follow-up: PT-Home health PT, OT-  Home health OT, SLP-  Projected Equipment Needs: PT-To be determined, OT- To be determined, SLP-  Equipment Details: PT- , OT-  Patient/family involved in discharge planning: PT- Patient,  OT-Patient, SLP-   MD ELOS: 7-12 days. Medical Rehab Prognosis:  Good Assessment: 86 year old right-handed male history of TIA as well as shingles on no prescription medications.  Presented 04/03/2019 with increasing shortness of breath and chest discomfort.  He  recently had sustained a left foot strain for which she had a  boot in place he tripped on the boot and fell March 7 without loss of consciousness.  He did strike the right side of his head and face.  Admission chemistries unremarkable, WBC 12,000, troponin high-sensitivity 369.  Cranial CT/MRI scan showed no evidence of intracranial abnormality.  CT cervical spine negative for fracture or dislocation however there was a nondisplaced left zygomatic arch fracture.  CT angiogram of the chest positive for pulmonary embolus with findings worrisome for right heart strain.  Peripheral wedge-shaped opacity in left lower lobe worrisome for pulmonary infarct.  MRI of the right knee showed focal radial tear at the posterior horn body junction of the medial meniscus.  Probable nondisplaced horizontal tear component involving the posterior horn.  Popliteus tendon injury and muscle strain with moderate joint effusion.  Orthopedic services Dr. Ihor Gully follow-up with conservative care weightbearing as tolerated.  Cardiology service follow-up for evaluation of possible atrial fibrillation with RVR an echocardiogram ejection fraction 45%.  His elevated troponin felt to be related to demand ischemia.  Patient was started on intravenous heparin transition to Eliquis for both pulmonary emboli as well as atrial fibrillation.  He is tolerating a regular diet.  Patient with resulting functional deficits with mobility, endurance, self-care. Will set goals for mod I/supervision with PT/OT.   Due to the current state of emergency, patients may not be receiving their 3-hours of Medicare-mandated therapy.  See Team Conference Notes for weekly updates to the plan of care

## 2019-04-12 NOTE — Progress Notes (Signed)
Occupational Therapy Session Note  Patient Details  Name: Willie Cisneros MRN: [id number] Date of Birth: [birth year]  Today's Date: 04/12/2019 OT Individual Time: 4268-3419 OT Individual Time Calculation (min): 69 min    Short Term Goals: Week 1:  OT Short Term Goal 1 (Week 1): Pt will tolerate standing for 2 grooming tasks at the sink OT Short Term Goal 2 (Week 1): Pt will thread LB clothing with setup OT Short Term Goal 3 (Week 1): Pt will don post op shoe on left and another regular shoe on the right with min A OT Short Term Goal 4 (Week 1): Pt will perform 3/3 toileting tasks with mod A  Skilled Therapeutic Interventions/Progress Updates:    Treatment session with focus on functional mobility, transfers, dynamic standing balance during self-care tasks and therapeutic activities.  Pt received semi-reclined in bed agreeable to shower.  Completed bed mobility with supervision requiring mod assist for initial sit > stand and min assist for subsequent sit > stands for remainder of session.  Ambulated to room shower with RW with min assist.  Completed bathing with lateral leans and sit > stand to wash buttocks, min assist for standing balance while washing buttocks.  Engaged in dressing in bathroom from [facility] with focus on increased safety with LB dressing at sit > stand level.  Pt able to doff shoes and off loading shoe on Lt, but requires assist to don Lt shoe.  Engaged in dynamic standing activity at table with focus on endurance and balance, CGA for standing balance.  Returned to supine to rest until next therapy session.  Therapy Documentation Precautions:  Precautions Precautions: Fall Precaution Comments: WBAT Required Braces or Orthoses: Other Brace Other Brace: post op boot on left foot Restrictions Weight Bearing Restrictions: No Pain: Pain Assessment Pain Scale: 0-10 Pain Score: 0-No pain   Therapy/Group: Individual Therapy  Rosalio Loud 04/12/2019, 12:23 PM

## 2019-04-12 NOTE — Progress Notes (Signed)
Patient information reviewed and entered into eRehab System by Becky Ayanah Snader, PPS coordinator. Information including medical coding, function ability, and quality indicators will be reviewed and updated through discharge.   

## 2019-04-12 NOTE — Progress Notes (Signed)
Occupational Therapy Session Note  Patient Details  Name: Willie Cisneros MRN: [id number] Date of Birth: [birth year]  Today's Date: 04/12/2019 OT Individual Time: 8295-6213 OT Individual Time Calculation (min): 70 min    Short Term Goals: Week 1:  OT Short Term Goal 1 (Week 1): Pt will tolerate standing for 2 grooming tasks at the sink OT Short Term Goal 2 (Week 1): Pt will thread LB clothing with setup OT Short Term Goal 3 (Week 1): Pt will don post op shoe on left and another regular shoe on the right with min A OT Short Term Goal 4 (Week 1): Pt will perform 3/3 toileting tasks with mod A  Skilled Therapeutic Interventions/Progress Updates:  Patient met seated in wc in agreement with OT treatment session. Skilled OT services provided with focus on functional transfers, self-care re-education, and household functional mobility as detailed below. Patient demonstrates good safety awareness with use of RW with all functional transfers this date.   Patient ambulated ~68f in hallway with RW and Min A. Patient transported to ADL apartment with total assist for time management. Functional transfers to and from bed with supine <> EOB with supervision A. Patient able to go from standing to sitting on low couch and recliner with Mod A for sit to stand. All other sit to stands from commode and wc with Min A and use of armrests/grab bar. Patient completed shower transfer with use of RW and Min A with vc's for hand placement and walker negotiation. Patient transported back to room. Chair to bed with Min A and return to supine with supervision A. Patient left lying supine in bed with call bell within reach, bed alarm activated, and all needs met.  Therapy Documentation Precautions:  Precautions Precautions: Fall Precaution Comments: WBAT Required Braces or Orthoses: Other Brace Other Brace: post op boot on left foot Restrictions Weight Bearing Restrictions: No   Therapy/Group: Individual  Therapy  Keller Mikels R Howerton-Davis 04/12/2019, 4:01 PM

## 2019-04-12 NOTE — Care Management (Signed)
[facility] Individual Statement of Services  Patient Name:  Willie Cisneros  Date:  04/12/2019  Welcome to the [facility]patient [facility].  Our goal is to provide you with an individualized program based on your diagnosis and situation, designed to meet your specific needs.  With this comprehensive rehabilitation program, you will be expected to participate in at least 3 hours of rehabilitation therapies Monday-Friday, with modified therapy programming on the weekends.  Your rehabilitation program will include the following services:  Physical Therapy (PT), Occupational Therapy (OT), Speech Therapy (ST), 24 hour per day rehabilitation nursing, Therapeutic Recreaction (TR), Neuropsychology, Case Management (Social Worker), Rehabilitation Medicine, Nutrition Services and Pharmacy Services  Weekly team conferences will be held on Wednesdays to discuss your progress.  Your Social Automotive engineer will talk with you frequently to get your input and to update you on team discussions.  Team conferences with you and your family in attendance may also be held.  Expected length of stay: 10-14 days Overall anticipated outcome: Modified Independent  Depending on your progress and recovery, your program may change. Your Social Automotive engineer will coordinate services and will keep you informed of any changes. Your Social Worker's/Care Manager's name and contact numbers are listed  below.  The following services may also be recommended but are not provided by the Inpatient Rehabilitation Center:    Home Health Rehabiltiation Services  Outpatient Rehabilitation Services   Arrangements will be made to provide these services after discharge if needed.  Arrangements include referral to agencies that provide these services.  Your insurance has been verified to be:  [facility] Your primary doctor is:  Selena Batten, MD  Pertinent information will be shared with your doctor and your  insurance company.  Care Manager/Social Worker:  Chana Bode, RN  [phone] or [phone]  Information discussed with and copy given to patient by: Pamelia Hoit, 04/12/2019, 2:40 PM

## 2019-04-13 ENCOUNTER — Inpatient Hospital Stay (HOSPITAL_COMMUNITY): Payer: Medicare Other

## 2019-04-13 ENCOUNTER — Inpatient Hospital Stay (HOSPITAL_COMMUNITY): Payer: Medicare Other | Admitting: Occupational Therapy

## 2019-04-13 DIAGNOSIS — D62 Acute posthemorrhagic anemia: Secondary | ICD-10-CM

## 2019-04-13 NOTE — Progress Notes (Signed)
[place] PHYSICAL MEDICINE & REHABILITATION PROGRESS NOTE   Subjective/Complaints: Patient seen sitting up in bed this morning.  He states he slept well overnight.  He states he had a good day in therapy yesterday.  He appears less perseverative on going home.  ROS: Denies CP. SOB, N/V/D  Objective:   No results found. Recent Labs    04/12/19 0525  WBC 9.4  HGB 12.1*  HCT 37.7*  PLT 312   Recent Labs    04/12/19 0525  NA 139  K 3.8  CL 102  CO2 26  GLUCOSE 114*  BUN 17  CREATININE 1.02  CALCIUM 8.8*    Intake/Output Summary (Last 24 hours) at 04/13/2019 0827 Last data filed at 04/13/2019 0817 Gross per 24 hour  Intake 600 ml  Output 1600 ml  Net -1000 ml     Physical Exam: Vital Signs Blood pressure 110/66, pulse 82, temperature 98.3 F (36.8 C), temperature source Oral, resp. rate 16, height 5\' 10"  (1.778 m), weight 80.4 kg, SpO2 96 %. Constitutional: No distress . Vital signs reviewed. HENT: Normocephalic.  Facial ecchymosis Eyes: EOMI. No discharge. Cardiovascular: No JVD. Respiratory: Normal effort.  No stridor. GI: Non-distended. Skin: Abrasion over right patella Psych: Normal mood.  Normal behavior. Musc: No edema in extremities.  No tenderness in extremities. Neurologic: Alert Motor: 4+-5/5 throughout, unchanged  Assessment/Plan: 1. Functional deficits secondary to debility  which require 3+ hours per day of interdisciplinary therapy in a comprehensive inpatient rehab setting.  Physiatrist is providing close team supervision and 24 hour management of active medical problems listed below.  Physiatrist and rehab team continue to assess barriers to discharge/monitor patient progress toward functional and medical goals  Care Tool:  Bathing    Body parts bathed by patient: Right arm, Chest, Abdomen, Front perineal area, Right upper leg, Left upper leg, Right lower leg, Left lower leg, Face, Buttocks, Left arm   Body parts bathed by helper: Left  arm, Buttocks     Bathing assist Assist Level: Minimal Assistance - Patient > 75%     Upper Body Dressing/Undressing Upper body dressing   What is the patient wearing?: Pull over shirt    Upper body assist Assist Level: Set up assist    Lower Body Dressing/Undressing Lower body dressing      What is the patient wearing?: Incontinence brief, Pants     Lower body assist Assist for lower body dressing: Minimal Assistance - Patient > 75%     Toileting Toileting    Toileting assist Assist for toileting: Minimal Assistance - Patient > 75% Assistive Device Comment: urinal   Transfers Chair/bed transfer  Transfers assist     Chair/bed transfer assist level: Minimal Assistance - Patient > 75%     Locomotion Ambulation   Ambulation assist      Assist level: Minimal Assistance - Patient > 75% Assistive device: Walker-rolling Max distance: 168ft   Walk 10 feet activity   Assist  Walk 10 feet activity did not occur: Safety/medical concerns(bilateral LE weakness, decreased postural control)  Assist level: Minimal Assistance - Patient > 75% Assistive device: Walker-rolling   Walk 50 feet activity   Assist Walk 50 feet with 2 turns activity did not occur: Safety/medical concerns(fatigue, bilateral LE weakness, decreased postural control)  Assist level: Minimal Assistance - Patient > 75% Assistive device: Walker-rolling    Walk 150 feet activity   Assist Walk 150 feet activity did not occur: Safety/medical concerns(fatigue, bilateral LE weakness, decreased postural control)  Walk 10 feet on uneven surface  activity   Assist Walk 10 feet on uneven surfaces activity did not occur: Safety/medical concerns(fatigue, bilateral LE weakness, decreased postural control)         Wheelchair     Assist Will patient use wheelchair at discharge?: Yes Type of Wheelchair: Manual    Wheelchair assist level: Supervision/Verbal cueing Max wheelchair  distance: 148ft    Wheelchair 50 feet with 2 turns activity    Assist        Assist Level: Supervision/Verbal cueing   Wheelchair 150 feet activity     Assist      Assist Level: Supervision/Verbal cueing   Medical Problem List and Plan: 1.  Debility with decreased functional mobility secondary to pulmonary emboli  Continue CIR 2.  Antithrombotics: -DVT/anticoagulation:  Cont Eliquis 10mg  BID             -antiplatelet therapy: N/A 3. Pain Management: Tylenol as needed  Controlled on 3/23 4. Mood: provide emotional support             -antipsychotic agents: N/A 5. Neuropsych: This patient is capable of making decisions on his own behalf. 6. Skin/Wound Care: Routine skin checks  Monitor areas of trauma for healing 7. Fluids/Electrolytes/Nutrition: Routine in and outs  8.  Atrial fibrillation.  Continue Eliquis as well as Lopressor 100 mg twice daily.  Cardiac rate controlled.  Follow-up cardiology services Vitals:   04/13/19 0405 04/13/19 0549  BP: 109/66 110/66  Pulse: 75 82  Resp: 17 16  Temp: 98.9 F (37.2 C) 98.3 F (36.8 C)  SpO2: 95% 96%   Rate controlled on 3/23 9.  Recent fall 3/7 PTA Right knee focal radial tear at the posterior horn body junction of the medial meniscus.  Weightbearing as tolerated and follow-up orthopedic services Dr. Alvan Dame 10. Nondisplaced left zygomatic arch fracture.  Conservative care 11. Left foot strain from recent fall.  Patient does wear a boot as needed 12. Elevated troponin 2/2 demand ischemia.   Monitor for chest pain 13.  Acute blood loss anemia  Hemoglobin 12.1 on 3/22  Continue to monitor  LOS: 4 days A FACE TO FACE EVALUATION WAS PERFORMED  Teila Skalsky Lorie Phenix 04/13/2019, 8:27 AM

## 2019-04-13 NOTE — Plan of Care (Signed)
  Problem: Consults Goal: RH GENERAL PATIENT EDUCATION Description: See Patient Education module for education specifics. Outcome: Progressing   Problem: RH BOWEL ELIMINATION Goal: RH STG MANAGE BOWEL WITH ASSISTANCE Description: STG Manage Bowel with Mod I Assistance. Outcome: Progressing   Problem: RH SAFETY Goal: RH STG ADHERE TO SAFETY PRECAUTIONS W/ASSISTANCE/DEVICE Description: STG Adhere to Safety Precautions With Mod I Assistance/Device. Outcome: Progressing   Problem: RH PAIN MANAGEMENT Goal: RH STG PAIN MANAGED AT OR BELOW PT'S PAIN GOAL Description: Less than 4  Outcome: Progressing   Problem: RH KNOWLEDGE DEFICIT GENERAL Goal: RH STG INCREASE KNOWLEDGE OF SELF CARE AFTER HOSPITALIZATION Description: Remain free of injuries, prevent falls with mod I assist Outcome: Progressing

## 2019-04-13 NOTE — Progress Notes (Signed)
Physical Therapy Session Note  Patient Details  Name: Willie Cisneros MRN: [id number] Date of Birth: 1926-03-05  Today's Date: 04/13/2019 PT Individual Time: 1100-1157 PT Individual Time Calculation (min): 57 min   Short Term Goals: Week 1:  PT Short Term Goal 1 (Week 1): Pt will perform bed mobility with supervision PT Short Term Goal 2 (Week 1): Pt will perform bed<>chair transfer with LRAD min A PT Short Term Goal 3 (Week 1): Pt will perform car transfer with LRAD min A  Skilled Therapeutic Interventions/Progress Updates:   Treatment Session 1: 1100-1157 57 min Received pt sitting in WC, pt agreeable to therapy, and denied any pain during session. Session focused on functional mobility/transfers, LE strength, dynamic standing balance/coordination, ambulation, stair navigation, and improved endurance with activity. Pt performed WC mobility 164ft using bilateral UEs and supervision. Pt navigated 24 3in steps with 2 rails CGA ascending with a step through pattern and descending with a step to pattern. Pt ambulated 347ft with RW CGA. Pt demonstrating R LE ER, decreased stride length, and flexed trunk requiring min verbal cues to correct. Pt required multiple rest breaks throughout session however, demonstrates improvements with overall endurance. Pt performed bilateral UE and LE strengthening on Nustep at workload 5 for 14 minutes for a total of 810 steps. Pt reported "this feels so good" while exercising on Nustep. Pt transferred stand<>pivot Nustep<>WC without AD min A. Pt transported back to room in [facility] total assist. Concluded session with pt sitting in WC, needs within reach, and chair pad alarm on.   Treatment Session 2: 1330-1426 56 min Received pt sitting on toilet with RN, therapist took over with care, pt agreeable to therapy, and denied any pain during session. Session focused on functional mobility/transfers, toileting, LE strength, dynamic standing balance/coordination, ambulation, and  improved endurance with activity. Pt able to perform hygiene management independently. Pt transferred sit<>stand with RW from low toilet with mod/max A due to low surface. Pt ambulated 16ft with RW CGA back to WC and washed hands seated in WC at sink with supervision. Pt transported to dayroom in [facility] total assist for time management purposes. Pt ambulated 45ft with RW CGA to mat. Pt transferred sit<>stand without UE support x 6 trials CGA. Pt relying on momentum to push himself upright. Pt performed standing mini squats CGA without UE support x10 and with bilateral UE support on RW x10. Pt required verbal cues for technique and positioning. Pt performed standing high knee marching with bilateral UE support on RW 2x10 bilaterally CGA. Pt performed standing hip abduction with bilateral UE support on chair with CGA 2x10 bilaterally with verbal cues for technique. Pt performed standing alternating toe taps to 6in step with bilateral UE support on RW supervision x 25 bilaterally. Pt transferred stand<>pivot mat<>WC without AD min A. Pt transported back to room in [facility] total assist. Concluded session with pt sitting in WC, needs within reach, and chair pad alarm on.   Therapy Documentation Precautions:  Precautions Precautions: Fall Precaution Comments: WBAT Required Braces or Orthoses: Other Brace Other Brace: post op boot on left foot Restrictions Weight Bearing Restrictions: No  Therapy/Group: Individual Therapy Martin Majestic PT, DPT   04/13/2019, 7:36 AM

## 2019-04-13 NOTE — Progress Notes (Signed)
Occupational Therapy Session Note  Patient Details  Name: Willie Cisneros MRN: [id number] Date of Birth: [birth year]  Today's Date: 04/13/2019 OT Individual Time: 0845-1000 OT Individual Time Calculation (min): 75 min    Short Term Goals: Week 1:  OT Short Term Goal 1 (Week 1): Pt will tolerate standing for 2 grooming tasks at the sink OT Short Term Goal 2 (Week 1): Pt will thread LB clothing with setup OT Short Term Goal 3 (Week 1): Pt will don post op shoe on left and another regular shoe on the right with min A OT Short Term Goal 4 (Week 1): Pt will perform 3/3 toileting tasks with mod A  Skilled Therapeutic Interventions/Progress Updates:    Treatment session with focus on functional mobility, dynamic standing balance, and BUE strengthening.  Pt received semi-reclined in bed dressed for the day.  Pt donned Rt shoe and Lt offloading shoe with increased time.  Pt required min assist for sit > stand from elevated EOB and then ambulated 200' with RW with CGA.  Engaged in dynamic standing activity incorporating alternating stepping pattern to challenge weight shifting and balance reactions.  Completed obstacle course to include stepping over dowels and navigating between cones with RW with CGA.  Min cues for technique with stepping over obstacle with RW.  Engaged in BUE strengthening in sitting and standing with 1kg ball progressing to standing and incorporating trunk rotation and PNF pattern reaching.  Returned to room and left upright in w/c with chair alarm on and all needs in reach.  Therapy Documentation Precautions:  Precautions Precautions: Fall Precaution Comments: WBAT Required Braces or Orthoses: Other Brace Other Brace: post op boot on left foot Restrictions Weight Bearing Restrictions: No Pain:  Pt with no c/o pain   Therapy/Group: Individual Therapy  Rosalio Loud 04/13/2019, 12:24 PM

## 2019-04-14 ENCOUNTER — Inpatient Hospital Stay (HOSPITAL_COMMUNITY): Payer: Medicare Other | Admitting: Occupational Therapy

## 2019-04-14 ENCOUNTER — Inpatient Hospital Stay (HOSPITAL_COMMUNITY): Payer: Medicare Other

## 2019-04-14 NOTE — Patient Care Conference (Signed)
Inpatient RehabilitationTeam Conference and Plan of Care Update Date: 04/14/2019   Time: 11:45 AM    Patient Name: Willie Cisneros      Medical Record Number: [id number]  Date of Birth: 1926-11-08 Sex: Male         Room/Bed: 4M08C/4M08C-01 Payor Info: Payor: MEDICARE / Plan: MEDICARE PART A AND B / Product Type: *No Product type* /    Admit Date/Time:  04/09/2019  5:59 PM  Primary Diagnosis:  Debility  Patient Active Problem List   Diagnosis Date Noted  . Acute blood loss anemia   . Atrial fibrillation ([place])   . Trauma   . Atrial fibrillation with RVR ([place]) 04/09/2019  . Debility 04/09/2019  . Pulmonary embolism ([place]) 04/03/2019    Expected Discharge Date: Expected Discharge Date: 04/16/19  Team Members Present: Physician leading conference: Dr. Delice Lesch Care Coodinator Present: Karene Fry, RN, MSN;Deborah Tobin Chad, RN, BSN, CRRN Nurse Present: Blair Heys, RN PT Present: Becky Sax, PT OT Present: Elisabeth Most, OT SLP Present: Stormy Fabian, SLP PPS Coordinator present : Gunnar Fusi, Novella Olive, PT     Current Status/Progress Goal Weekly Team Focus  Bowel/Bladder   patient is continent of bowel and bladder. LBM 04/12/19  maintain continence  q shift and PRN assistance of toileting   Swallow/Nutrition/ Hydration             ADL's   Min assist bathing/dressing, Min A transfers with RW  Supervision shower transfers and bathing, Mod I toileting, standing balance, and transfers  ADL retraining, dynamic standing balance, activity tolerance, funtional mobility/transfers   Mobility   bed mobility CGA/supervision, transfers with RW min A, 24 3in steps with 2 rails CGA, ambulating 350ft with RW CGA, WC mobility 151ft with supervision  Mod I transfers, supervision ambulation  functional mobility/transfers, stair navigation, ambulation, LE strength, dynamic standing balance, endurance   Communication             Safety/Cognition/ Behavioral Observations             Pain   patient denies pain  pain <4 in left foot d/t sprain  q shift and PRN pain assessment, medicate PRN pain medication   Skin   abrasion in R knee, bruises in R eye and arms, rashes in back  prevent from skin breakdown and infection  q shift and PRN skin assessment    Rehab Goals Patient on target to meet rehab goals: Yes *See Care Plan and progress notes for long and short-term goals.     Barriers to Discharge  Current Status/Progress Possible Resolutions Date Resolved   Nursing                  PT  Decreased caregiver support  primary caregiver for wife who has dementia              OT                  SLP                SW Other (comments)(Provides supervision for his wife)   Given copy of private duty companies to enlist hired help for wife and him at discharge when son is not around          Discharge Planning/Teaching Needs:  Home with wife (caregiver for wife) and son to assist prn  TBD   Team Discussion: MD monitoring BP and HR.  RN taking meds, cont, R knee dressing over skin tears.  OT min a stand lower surface, shower distant S, slow and meticulous, mod I goals, S shower goal.  PT CGA transfers, up 24 steps CGA, amb 322' CGA, mod I transfer goals, S amb goals.   Revisions to Treatment Plan: N/A     Medical Summary Current Status: Debility with decreased functional mobility secondary to pulmonary emboli Weekly Focus/Goal: Improve mobility, HR/BP  Barriers to Discharge: Medical stability   Possible Resolutions to Barriers: Therapies, safety, follow BP/HR   Continued Need for Acute Rehabilitation Level of Care: The patient requires daily medical management by a physician with specialized training in physical medicine and rehabilitation for the following reasons: Direction of a multidisciplinary physical rehabilitation program to maximize functional independence : Yes Medical management of patient stability for increased activity during participation in an  intensive rehabilitation regime.: Yes Analysis of laboratory values and/or radiology reports with any subsequent need for medication adjustment and/or medical intervention. : Yes   I attest that I was present, lead the team conference, and concur with the assessment and plan of the team.   Lelon Frohlich M 04/14/2019, 9:20 PM   Team conference was held via web/ teleconference due to COVID - 19

## 2019-04-14 NOTE — Progress Notes (Addendum)
Willie Cisneros PHYSICAL MEDICINE & REHABILITATION PROGRESS NOTE   Subjective/Complaints: Patient seen sitting up in bed this morning.  He states he slept well overnight.  He states therapies are going well.  He is looking forward to hearing about his discharge date.  He has questions regarding pillows under his knees. He states he is evaluating HH options.  ROS: Denies CP. SOB, N/V/D  Objective:   No results found. Recent Labs    04/12/19 0525  WBC 9.4  HGB 12.1*  HCT 37.7*  PLT 312   Recent Labs    04/12/19 0525  NA 139  K 3.8  CL 102  CO2 26  GLUCOSE 114*  BUN 17  CREATININE 1.02  CALCIUM 8.8*    Intake/Output Summary (Last 24 hours) at 04/14/2019 1057 Last data filed at 04/14/2019 0700 Gross per 24 hour  Intake 720 ml  Output 600 ml  Net 120 ml     Physical Exam: Vital Signs Blood pressure 98/67, pulse 70, temperature 97.7 F (36.5 C), temperature source Oral, resp. rate 16, height 5\' 10"  (1.778 m), weight 80.2 kg, SpO2 100 %. Constitutional: No distress . Vital signs reviewed. HENT: Normocephalic.  Atraumatic.  Facial ecchymosis Eyes: EOMI. No discharge. Cardiovascular: No JVD. Respiratory: Normal effort.  No stridor. GI: Non-distended. Skin: Right knee with dressing over patella C/D/I Psych: Normal mood.  Normal behavior. Musc: Lower extremity edema. Neurologic: Alert Motor: 4+-5/5 throughout, stable  Assessment/Plan: 1. Functional deficits secondary to debility  which require 3+ hours per day of interdisciplinary therapy in a comprehensive inpatient rehab setting.  Physiatrist is providing close team supervision and 24 hour management of active medical problems listed below.  Physiatrist and rehab team continue to assess barriers to discharge/monitor patient progress toward functional and medical goals  Care Tool:  Bathing    Body parts bathed by patient: Right arm, Chest, Abdomen, Front perineal area, Right upper leg, Left upper leg, Right lower  leg, Left lower leg, Face, Buttocks, Left arm   Body parts bathed by helper: Left arm, Buttocks     Bathing assist Assist Level: Minimal Assistance - Patient > 75%     Upper Body Dressing/Undressing Upper body dressing   What is the patient wearing?: Pull over shirt    Upper body assist Assist Level: Set up assist    Lower Body Dressing/Undressing Lower body dressing      What is the patient wearing?: Incontinence brief, Pants     Lower body assist Assist for lower body dressing: Minimal Assistance - Patient > 75%     Toileting Toileting    Toileting assist Assist for toileting: Minimal Assistance - Patient > 75% Assistive Device Comment: urinal   Transfers Chair/bed transfer  Transfers assist     Chair/bed transfer assist level: Minimal Assistance - Patient > 75%     Locomotion Ambulation   Ambulation assist      Assist level: Contact Guard/Touching assist Assistive device: Walker-rolling Max distance: 360ft   Walk 10 feet activity   Assist  Walk 10 feet activity did not occur: Safety/medical concerns(bilateral LE weakness, decreased postural control)  Assist level: Contact Guard/Touching assist Assistive device: Walker-rolling   Walk 50 feet activity   Assist Walk 50 feet with 2 turns activity did not occur: Safety/medical concerns(fatigue, bilateral LE weakness, decreased postural control)  Assist level: Contact Guard/Touching assist Assistive device: Walker-rolling    Walk 150 feet activity   Assist Walk 150 feet activity did not occur: Safety/medical concerns(fatigue, bilateral LE weakness,  decreased postural control)  Assist level: Contact Guard/Touching assist Assistive device: Walker-rolling    Walk 10 feet on uneven surface  activity   Assist Walk 10 feet on uneven surfaces activity did not occur: Safety/medical concerns(fatigue, bilateral LE weakness, decreased postural control)         Wheelchair     Assist Will  patient use wheelchair at discharge?: Yes Type of Wheelchair: Manual    Wheelchair assist level: Supervision/Verbal cueing Max wheelchair distance: 135ft    Wheelchair 50 feet with 2 turns activity    Assist        Assist Level: Supervision/Verbal cueing   Wheelchair 150 feet activity     Assist      Assist Level: Supervision/Verbal cueing   Medical Problem List and Plan: 1.  Debility with decreased functional mobility secondary to pulmonary emboli  Continue CIR  Team conference today to discuss current and goals and coordination of care, home and environmental barriers, and discharge planning with nursing, case manager, and therapies.  2.  Antithrombotics: -DVT/anticoagulation:  Cont Eliquis 10mg  BID             -antiplatelet therapy: N/A 3. Pain Management: Tylenol as needed  Controlled on 3/24 4. Mood: provide emotional support             -antipsychotic agents: N/A 5. Neuropsych: This patient is capable of making decisions on his own behalf. 6. Skin/Wound Care: Routine skin checks  Monitor areas of trauma for healing 7. Fluids/Electrolytes/Nutrition: Routine in and outs  8.  Atrial fibrillation.  Continue Eliquis as well as Lopressor 100 mg twice daily.  Follow-up cardiology services Vitals:   04/13/19 2229 04/14/19 0433  BP: 114/80 98/67  Pulse: 96 70  Resp:  16  Temp:  97.7 F (36.5 C)  SpO2:  100%   Labile BP/HR on 3/24 9.  Recent fall 3/7 PTA Right knee focal radial tear at the posterior horn body junction of the medial meniscus.  Weightbearing as tolerated and follow-up orthopedic services Dr. Alvan Dame 10. Nondisplaced left zygomatic arch fracture.  Conservative care 11. Left foot strain from recent fall.  Patient does wear a boot as needed 12. Elevated troponin 2/2 demand ischemia.   Monitor for chest pain 13.  Acute blood loss anemia  Hemoglobin 12.1 on 3/22  Continue to monitor  LOS: 5 days A FACE TO FACE EVALUATION WAS PERFORMED  Ronda Kazmi Lorie Phenix 04/14/2019, 10:57 AM

## 2019-04-14 NOTE — Plan of Care (Signed)
  Problem: Consults Goal: RH GENERAL PATIENT EDUCATION Description: See Patient Education module for education specifics. Outcome: Progressing   Problem: RH BOWEL ELIMINATION Goal: RH STG MANAGE BOWEL WITH ASSISTANCE Description: STG Manage Bowel with Mod I Assistance. Outcome: Progressing Flowsheets (Taken 04/14/2019 1455) STG: Pt will manage bowels with assistance: 4-Minimum assistance   Problem: RH SAFETY Goal: RH STG ADHERE TO SAFETY PRECAUTIONS W/ASSISTANCE/DEVICE Description: STG Adhere to Safety Precautions With Mod I Assistance/Device. Outcome: Progressing Flowsheets (Taken 04/14/2019 1455) STG:Pt will adhere to safety precautions with assistance/device: 4-Minimal assistance   Problem: RH PAIN MANAGEMENT Goal: RH STG PAIN MANAGED AT OR BELOW PT'S PAIN GOAL Description: Less than 4  Outcome: Progressing   Problem: RH KNOWLEDGE DEFICIT GENERAL Goal: RH STG INCREASE KNOWLEDGE OF SELF CARE AFTER HOSPITALIZATION Description: Remain free of injuries, prevent falls with mod I assist Outcome: Progressing

## 2019-04-14 NOTE — Progress Notes (Signed)
Occupational Therapy Session Note  Patient Details  Name: Willie Cisneros MRN: [id number] Date of Birth: [birth year]  Today's Date: 04/14/2019 OT Individual Time: 4098-1191 OT Individual Time Calculation (min): 60 min    Short Term Goals: Week 1:  OT Short Term Goal 1 (Week 1): Pt will tolerate standing for 2 grooming tasks at the sink OT Short Term Goal 2 (Week 1): Pt will thread LB clothing with setup OT Short Term Goal 3 (Week 1): Pt will don post op shoe on left and another regular shoe on the right with min A OT Short Term Goal 4 (Week 1): Pt will perform 3/3 toileting tasks with mod A  Skilled Therapeutic Interventions/Progress Updates:    Treatment session with focus on self-care retraining with functional mobility, transfers, and dynamic standing balance.  Pt completed sit > stand from EOB with CGA with RW and supervision with ambulation to room shower.  Pt completed all transfers with good safety awareness and supervision.  Pt completed bathing at sit > stand level with supervision when standing to wash buttocks.  Completed dressing at sit > stand level from EOB with pt quite methodical but demonstrating good safety awareness.  Engaged in discussion regarding pt personal goals and d/c planning.  Pt reports feeling close to baseline and reports plan to have PT 5 days/week from outside source to continue with his strengthening and endurance.    Therapy Documentation Precautions:  Precautions Precautions: Fall Precaution Comments: WBAT Required Braces or Orthoses: Other Brace Other Brace: post op boot on left foot Restrictions Weight Bearing Restrictions: No Pain: Pain Assessment Pain Scale: 0-10 Pain Score: 0-No pain   Therapy/Group: Individual Therapy  Rosalio Loud 04/14/2019, 12:25 PM

## 2019-04-14 NOTE — Progress Notes (Signed)
Physical Therapy Session Note  Patient Details  Name: Willie Cisneros MRN: [id number] Date of Birth: [birth year]  Today's Date: 04/14/2019 PT Individual Time: 4627-0350 and 1400-1458  PT Individual Time Calculation (min): 54 min and 58 min  Short Term Goals: Week 1:  PT Short Term Goal 1 (Week 1): Pt will perform bed mobility with supervision PT Short Term Goal 2 (Week 1): Pt will perform bed<>chair transfer with LRAD min A PT Short Term Goal 3 (Week 1): Pt will perform car transfer with LRAD min A  Skilled Therapeutic Interventions/Progress Updates:   Treatment Session 1: 1031-1125 54 min Received pt supine in bed, pt agreeable to therapy, and denied any pain during session. Session focused on functional mobility/transfers, LE strength, dynamic standing balance/coordination, ambulation, and improved endurance with activity. Pt transferred supine<>sitting EOB with supervision and increased time. Pt transferred stand<>pivot bed<>WC without AD min A. Pt performed WC mobility using bilateral LEs 165ft therapy gym with supervision. Pt required verbal cues for technique. Pt transferred WC<>mat without AD min A. Pt transferred sit<>stand without UE support x 8 reps with CGA from raised mat and increased time. Pt ambulated 111ft with RW CGA. Pt required rest breaks after activity. Pt performed standing horseshoe toss without UE support CGA x 2 trials. Pt performed TUG x 3 trials with RW with an average of 27.6 seconds. Pt educated on results, increased fall risk status, and importance of using RW at home for safety; pt agreed. Pt performed standing alteraniting to taps to 3in step x10 reps min A with handheld assist and verbal cues for control. Pt transported back to room in [facility] total assist. Concluded session with pt sitting in WC, needs within reach, and chair pad alarm on.   Treatment Session 2: 1400-1458 58 min Received pt sitting in WC, pt agreeable to therapy, and denied any pain during session.  Session focused on functional mobility/transfers, LE strength, dynamic standing balance/coordination, ambulation, and improved endurance with activity. Pt ambulated >336ft with RW and supervision to dayroom. Pt with flexed trunk, wide BOS, and decreased trunk rotation. Pt required verbal cues to decrease cadence for safety. Therapist provided pt with HEP printout, green resistance band, and educated pt on technique to perform the following exercises:  -Supine Bridge - 1 x daily - 7 x weekly - 10 reps - 3 sets -Heel rises with counter support - 1 x daily - 7 x weekly - 10 reps - 3 sets -Standing March with Counter Support - 1 x daily - 7 x weekly - 10 reps - 3 sets -Standing Hip Abduction with Counter Support - 1 x daily - 7 x weekly - 10 reps - 3 sets -Seated Hip Abduction with Resistance - 1 x daily - 7 x weekly - 10 reps - 3 sets -Clamshell with Resistance - 1 x daily - 7 x weekly - 10 reps - 3 sets  Pt performed seated LE strengthening on Kinetron at 10cm/sec 1x52reps bilaterally without UE support and 1x60 reps bilaterally with UE support and without back support. Pt required verbal cues for technique. Pt performed the following exercises seated in [facility] with supervision and verbal cues for technique.  -Seated hip abduction with grn TB 3x15 with 5 second isometric hold -Seated alternating hip flexion with grn TB x30 Concluded session with pt sitting in WC, needs within reach, and chair pad alarm on. Therapist provided ice water for pt.   Therapy Documentation Precautions:  Precautions Precautions: Fall Precaution Comments: WBAT Required Braces or Orthoses:  Other Brace Other Brace: post op boot on left foot Restrictions Weight Bearing Restrictions: No   Therapy/Group: Individual Therapy Alfonse Alpers PT, DPT   04/14/2019, 7:36 AM

## 2019-04-15 ENCOUNTER — Inpatient Hospital Stay (HOSPITAL_COMMUNITY): Payer: Medicare Other

## 2019-04-15 ENCOUNTER — Inpatient Hospital Stay (HOSPITAL_COMMUNITY): Payer: Medicare Other | Admitting: Occupational Therapy

## 2019-04-15 DIAGNOSIS — E8809 Other disorders of plasma-protein metabolism, not elsewhere classified: Secondary | ICD-10-CM

## 2019-04-15 DIAGNOSIS — E46 Unspecified protein-calorie malnutrition: Secondary | ICD-10-CM

## 2019-04-15 MED ORDER — METOPROLOL TARTRATE 100 MG PO TABS: 100.0000 mg | ORAL_TABLET | Freq: Two times a day (BID) | ORAL | 0 refills | Status: DC

## 2019-04-15 MED ORDER — APIXABAN 5 MG PO TABS: 5.0000 mg | ORAL_TABLET | Freq: Two times a day (BID) | ORAL | 1 refills | Status: DC

## 2019-04-15 MED ORDER — ACETAMINOPHEN 325 MG PO TABS: 650.0000 mg | ORAL_TABLET | Freq: Four times a day (QID) | ORAL | Status: DC | PRN

## 2019-04-15 MED ORDER — PRO-STAT SUGAR FREE PO LIQD
30.0000 mL | Freq: Two times a day (BID) | ORAL | Status: DC
  Administered 2019-04-15: 30 mL via ORAL
  Filled 2019-04-15 (×3): qty 30

## 2019-04-15 NOTE — Progress Notes (Signed)
[facility] PHYSICAL MEDICINE & REHABILITATION PROGRESS NOTE   Subjective/Complaints: Patient seen sitting up in bed this morning.  Good sitting balance noted.  He states he slept well overnight and feels more calm.  He is questions regarding discharge medications, DME, finances.  ROS: Denies CP. SOB, N/V/D  Objective:   No results found. No results for input(s): WBC, HGB, HCT, PLT in the last 72 hours. No results for input(s): NA, K, CL, CO2, GLUCOSE, BUN, CREATININE, CALCIUM in the last 72 hours.  Intake/Output Summary (Last 24 hours) at 04/15/2019 0827 Last data filed at 04/15/2019 0700 Gross per 24 hour  Intake 360 ml  Output -  Net 360 ml     Physical Exam: Vital Signs Blood pressure 111/73, pulse 84, temperature 97.9 F (36.6 C), temperature source Oral, resp. rate 16, height 5\' 10"  (1.778 m), weight 77.7 kg, SpO2 97 %. Constitutional: No distress . Vital signs reviewed. HENT: Normocephalic.  Atraumatic.  Facial ecchymosis. Eyes: EOMI. No discharge. Cardiovascular: No JVD. Respiratory: Normal effort.  No stridor. GI: Non-distended. Skin: Right knee with dressing over patella C/D/I Psych: Normal mood.  Normal behavior. Musc: Lower extremity edema Neurologic: Alert Motor: 4+-5/5 throughout, unchanged  Assessment/Plan: 1. Functional deficits secondary to debility  which require 3+ hours per day of interdisciplinary therapy in a comprehensive inpatient rehab setting.  Physiatrist is providing close team supervision and 24 hour management of active medical problems listed below.  Physiatrist and rehab team continue to assess barriers to discharge/monitor patient progress toward functional and medical goals  Care Tool:  Bathing    Body parts bathed by patient: Right arm, Chest, Abdomen, Front perineal area, Right upper leg, Left upper leg, Right lower leg, Left lower leg, Face, Buttocks, Left arm   Body parts bathed by helper: Left arm, Buttocks     Bathing assist  Assist Level: Supervision/Verbal cueing     Upper Body Dressing/Undressing Upper body dressing   What is the patient wearing?: Pull over shirt    Upper body assist Assist Level: Set up assist    Lower Body Dressing/Undressing Lower body dressing      What is the patient wearing?: Incontinence brief, Pants     Lower body assist Assist for lower body dressing: Set up assist     Toileting Toileting    Toileting assist Assist for toileting: Minimal Assistance - Patient > 75% Assistive Device Comment: urinal   Transfers Chair/bed transfer  Transfers assist     Chair/bed transfer assist level: Contact Guard/Touching assist     Locomotion Ambulation   Ambulation assist      Assist level: Contact Guard/Touching assist Assistive device: Walker-rolling Max distance: 160ft   Walk 10 feet activity   Assist  Walk 10 feet activity did not occur: Safety/medical concerns(bilateral LE weakness, decreased postural control)  Assist level: Contact Guard/Touching assist Assistive device: Walker-rolling   Walk 50 feet activity   Assist Walk 50 feet with 2 turns activity did not occur: Safety/medical concerns(fatigue, bilateral LE weakness, decreased postural control)  Assist level: Contact Guard/Touching assist Assistive device: Walker-rolling    Walk 150 feet activity   Assist Walk 150 feet activity did not occur: Safety/medical concerns(fatigue, bilateral LE weakness, decreased postural control)  Assist level: Contact Guard/Touching assist Assistive device: Walker-rolling    Walk 10 feet on uneven surface  activity   Assist Walk 10 feet on uneven surfaces activity did not occur: Safety/medical concerns(fatigue, bilateral LE weakness, decreased postural control)  Wheelchair     Assist Will patient use wheelchair at discharge?: Yes Type of Wheelchair: Manual    Wheelchair assist level: Supervision/Verbal cueing Max wheelchair distance:  114ft    Wheelchair 50 feet with 2 turns activity    Assist        Assist Level: Supervision/Verbal cueing   Wheelchair 150 feet activity     Assist      Assist Level: Supervision/Verbal cueing   Medical Problem List and Plan: 1.  Debility with decreased functional mobility secondary to pulmonary emboli  Continue CIR 2.  Antithrombotics: -DVT/anticoagulation:  Cont Eliquis 10mg  BID             -antiplatelet therapy: N/A 3. Pain Management: Tylenol as needed  Controlled on 3/25 4. Mood: provide emotional support             -antipsychotic agents: N/A 5. Neuropsych: This patient is capable of making decisions on his own behalf. 6. Skin/Wound Care: Routine skin checks  Monitor areas of trauma for healing 7. Fluids/Electrolytes/Nutrition: Routine in and outs   BMP ordered for tomorrow 8.  Atrial fibrillation.  Continue Eliquis as well as Lopressor 100 mg twice daily.  Follow-up cardiology services Vitals:   04/14/19 2030 04/15/19 0518  BP: 110/82 111/73  Pulse: (!) 104 84  Resp:  16  Temp:  97.9 F (36.6 C)  SpO2: 99% 97%   Heart rate slightly labile on 3/25 9.  Recent fall 3/7 PTA Right knee focal radial tear at the posterior horn body junction of the medial meniscus.  Weightbearing as tolerated and follow-up orthopedic services Dr. 5/7 10. Nondisplaced left zygomatic arch fracture.  Conservative care 11. Left foot strain from recent fall.  Patient does wear a boot as needed 12. Elevated troponin 2/2 demand ischemia.   Monitor for chest pain 13.  Acute blood loss anemia  Hemoglobin 12.1 on 3/22, labs ordered for tomorrow  Continue to monitor 14.  Mild hypoalbuminemia  Supplement initiated on 3/25  LOS: 6 days A FACE TO FACE EVALUATION WAS PERFORMED  Estie Sproule 4/25 04/15/2019, 8:27 AM

## 2019-04-15 NOTE — Discharge Summary (Addendum)
Physician Discharge Summary  Patient ID: Willie Cisneros MRN: [id number] DOB/AGE: June 25, 1926 86 y.o.  Admit date: 04/09/2019 Discharge date: 04/16/2019  Discharge Diagnoses:  Principal Problem:   Debility Active Problems:   Atrial fibrillation (HCC)   Trauma   Acute blood loss anemia   Hypoalbuminemia due to protein-calorie malnutrition (HCC) DVT prophylaxis Constipation Probable nondisplaced horizontal tear right knee involving the posterior horn Left nondisplaced zygomatic arch fracture  Discharged Condition: Stable  Significant Diagnostic Studies: DG Chest 2 View  Result Date: 04/03/2019 CLINICAL DATA:  Left chest pain EXAM: CHEST - 2 VIEW COMPARISON:  None. FINDINGS: Mild blunting left costophrenic angle, favored to reflect epicardial fat, clear on the lateral view. Right lung is clear. No pleural effusion or pneumothorax. The heart is top-normal in size. Visualized osseous structures are within normal limits. IMPRESSION: Normal chest radiographs. Electronically Signed   By: Charline Bills M.D.   On: 04/03/2019 09:22   CT Head Wo Contrast  Result Date: 04/03/2019 CLINICAL DATA:  Fall a few days prior. Right orbital bruise. Posterior neck pain. EXAM: CT HEAD WITHOUT CONTRAST CT CERVICAL SPINE WITHOUT CONTRAST TECHNIQUE: Multidetector CT imaging of the head and cervical spine was performed following the standard protocol without intravenous contrast. Multiplanar CT image reconstructions of the cervical spine were also generated. COMPARISON:  None. FINDINGS: CT HEAD FINDINGS Brain: No evidence of parenchymal hemorrhage or extra-axial fluid collection. No mass lesion, mass effect, or midline shift. No CT evidence of acute infarction. Nonspecific mild subcortical and periventricular white matter hypodensity, most in keeping with chronic small vessel ischemic change. Cerebral volume is age appropriate. No ventriculomegaly. Vascular: No acute abnormality. Skull: No evidence of calvarial  fracture. Nondisplaced left zygomatic arch fracture appears well corticated and chronic, with no overlying soft tissue swelling. Sinuses/Orbits: The visualized paranasal sinuses are essentially clear. Other:  The mastoid air cells are unopacified. CT CERVICAL SPINE FINDINGS Alignment: Straightening of the cervical spine. No facet subluxation. Dens is well positioned between the lateral masses of C1. Mild 3 mm retrolisthesis at C5-6 and C6-7 and 3 mm anterolisthesis at C7-T1. Skull base and vertebrae: No acute fracture. No primary bone lesion or focal pathologic process. Soft tissues and spinal canal: No prevertebral edema. No visible canal hematoma. Disc levels: Severe multilevel degenerative disc disease throughout the cervical spine, most prominent at C3-4, C5-6 and C6-7. Moderate to severe bilateral cervical facet arthropathy. Moderate degenerative foraminal stenosis bilaterally at C5-6. Upper chest: No acute abnormality. Other: Visualized mastoid air cells appear clear. No discrete thyroid nodules. No pathologically enlarged cervical nodes. IMPRESSION: 1. No evidence of acute intracranial abnormality. No evidence of calvarial fracture. 2. Mild chronic small vessel ischemic changes in the cerebral white matter. 3. Nondisplaced left zygomatic arch fracture appears well corticated and chronic, with no overlying soft tissue swelling. 4. No cervical spine fracture or facet subluxation. 5. 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 6. Mild multilevel cervical spondylolisthesis is probably degenerative. Electronically Signed   By: Delbert Phenix M.D.   On: 04/03/2019 11:56   CT Angio Chest PE W and/or Wo Contrast  Result Date: 04/03/2019 CLINICAL DATA:  Severe left chest pain today. EXAM: CT ANGIOGRAPHY CHEST WITH CONTRAST TECHNIQUE: Multidetector CT imaging of the chest was performed using the standard protocol during bolus administration of intravenous contrast. Multiplanar CT image  reconstructions and MIPs were obtained to evaluate the vascular anatomy. CONTRAST:  100 mL OMNIPAQUE IOHEXOL 350 MG/ML SOLN COMPARISON:  PA and lateral chest today. FINDINGS: Cardiovascular:  The examination is positive for pulmonary embolus. Clot is best seen in the left main pulmonary artery and its branches to both the upper and lower lobes. The RV to LV ratio is 1.22 worrisome for right heart strain. Cardiomegaly and calcific aortic and coronary atherosclerosis are noted. No pericardial effusion. Thoracic aorta is normal in caliber. Mediastinum/Nodes: No enlarged mediastinal, hilar, or axillary lymph nodes. Thyroid gland, trachea, and esophagus demonstrate no significant findings. Lungs/Pleura: Very small left pleural effusion is seen. Wedge-shaped peripheral opacity in the left lower lobe is worrisome for a pulmonary infarct. Dependent atelectasis is noted. Upper Abdomen: Single, small nonobstructing stones are seen in each kidney Musculoskeletal: No fracture or worrisome lesion. Multilevel spondylosis and mild scoliosis noted. Review of the MIP images confirms the above findings. IMPRESSION: The examination is positive for pulmonary embolus with findings worrisome for right heart strain. Peripheral wedge-shaped opacity in left lower lobe is worrisome for a pulmonary infarct. Cardiomegaly. Critical Value/emergent results were called by telephone at the time of interpretation on 04/03/2019 at 11:52 am to provider MELANIE BELFI , who verbally acknowledged these results. Aortic Atherosclerosis (ICD10-I70.0). Calcific coronary artery disease also noted. Electronically Signed   By: Drusilla Kanner M.D.   On: 04/03/2019 11:55   CT Cervical Spine Wo Contrast  Result Date: 04/03/2019 CLINICAL DATA:  Fall a few days prior. Right orbital bruise. Posterior neck pain. EXAM: CT HEAD WITHOUT CONTRAST CT CERVICAL SPINE WITHOUT CONTRAST TECHNIQUE: Multidetector CT imaging of the head and cervical spine was performed  following the standard protocol without intravenous contrast. Multiplanar CT image reconstructions of the cervical spine were also generated. COMPARISON:  None. FINDINGS: CT HEAD FINDINGS Brain: No evidence of parenchymal hemorrhage or extra-axial fluid collection. No mass lesion, mass effect, or midline shift. No CT evidence of acute infarction. Nonspecific mild subcortical and periventricular white matter hypodensity, most in keeping with chronic small vessel ischemic change. Cerebral volume is age appropriate. No ventriculomegaly. Vascular: No acute abnormality. Skull: No evidence of calvarial fracture. Nondisplaced left zygomatic arch fracture appears well corticated and chronic, with no overlying soft tissue swelling. Sinuses/Orbits: The visualized paranasal sinuses are essentially clear. Other:  The mastoid air cells are unopacified. CT CERVICAL SPINE FINDINGS Alignment: Straightening of the cervical spine. No facet subluxation. Dens is well positioned between the lateral masses of C1. Mild 3 mm retrolisthesis at C5-6 and C6-7 and 3 mm anterolisthesis at C7-T1. Skull base and vertebrae: No acute fracture. No primary bone lesion or focal pathologic process. Soft tissues and spinal canal: No prevertebral edema. No visible canal hematoma. Disc levels: Severe multilevel degenerative disc disease throughout the cervical spine, most prominent at C3-4, C5-6 and C6-7. Moderate to severe bilateral cervical facet arthropathy. Moderate degenerative foraminal stenosis bilaterally at C5-6. Upper chest: No acute abnormality. Other: Visualized mastoid air cells appear clear. No discrete thyroid nodules. No pathologically enlarged cervical nodes. IMPRESSION: 1. No evidence of acute intracranial abnormality. No evidence of calvarial fracture. 2. Mild chronic small vessel ischemic changes in the cerebral white matter. 3. Nondisplaced left zygomatic arch fracture appears well corticated and chronic, with no overlying soft tissue  swelling. 4. No cervical spine fracture or facet subluxation. 5. 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 6. Mild multilevel cervical spondylolisthesis is probably degenerative. Electronically Signed   By: Delbert Phenix M.D.   On: 04/03/2019 11:56   MR BRAIN WO CONTRAST  Result Date: 04/05/2019 CLINICAL DATA:  Post fall, possible CVA EXAM: MRI HEAD WITHOUT CONTRAST TECHNIQUE: Multiplanar,  multiecho pulse sequences of the brain and surrounding structures were obtained without intravenous contrast. COMPARISON:  None. FINDINGS: Brain: There is no acute infarction or intracranial hemorrhage. There is no intracranial mass, mass effect, or edema. There is no hydrocephalus or extra-axial fluid collection. Patchy foci of T2 hyperintensity in the supratentorial white matter are but may mild chronic microvascular ischemic changes. 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 Vascular: Major vessel flow voids at the skull base are preserved. Skull and upper cervical spine: Normal marrow signal is preserved. Sinuses/Orbits: Paranasal sinuses are aerated. Bilateral lens replacements. Other: Sella is unremarkable.  Mastoid air cells are clear. IMPRESSION: No evidence of recent infarction, hemorrhage, or mass. Mild chronic microvascular ischemic changes. Electronically Signed   By: Guadlupe Spanish M.D.   On: 04/05/2019 14:55   MR EAVWU RIGHT WO CONTRAST  Result Date: 04/05/2019 CLINICAL DATA:  Right thigh pain. Fall week ago. EXAM: MRI OF THE RIGHT FEMUR WITHOUT CONTRAST TECHNIQUE: Multiplanar, multisequence MR imaging of the right femur was performed. No intravenous contrast was administered. COMPARISON:  Right knee x-rays dated April 03, 2019. FINDINGS: Bones/Joint/Cartilage No marrow signal abnormality. No fracture or dislocation. Normal alignment. Small right knee joint effusion. Trace fluid in the right greater trochanteric bursa. Muscles and Tendons Intact. Mild fatty  infiltration of the semimembranosus and biceps femoris muscles. No muscle edema. Soft tissue No fluid collection or hematoma. No soft tissue mass. IMPRESSION: No acute injury of the right femur or thigh. Electronically Signed   By: Obie Dredge M.D.   On: 04/05/2019 15:20   MR KNEE RIGHT WO CONTRAST  Result Date: 04/06/2019 CLINICAL DATA:  Right knee pain after fall 1 week ago EXAM: MRI OF THE RIGHT KNEE WITHOUT CONTRAST TECHNIQUE: Multiplanar, multisequence MR imaging of the knee was performed. No intravenous contrast was administered. COMPARISON:  X-ray 04/03/2019. Right femur MRI 04/05/2019 FINDINGS: MENISCI Medial meniscus: Focal radial tear at the posterior horn-body junction (series 5, image 22; series 10, image 6). Possible nondisplaced horizontal tear of the posterior horn (series 10, image 7). Mild intrasubstance degeneration of the posterior horn and body. Lateral meniscus:  Intact. LIGAMENTS Cruciates:  Intact ACL and PCL. Collaterals: Medial collateral ligament is intact. Lateral collateral ligament complex is intact. CARTILAGE Patellofemoral: Trochlear chondral thinning without focal defect. Patellar cartilage intact. Medial: Mild partial-thickness cartilage loss of the medial femorotibial compartment. Lateral:  No chondral defect. Joint:  Moderate joint effusion. Unremarkable fat pads. Popliteal Fossa: Distal popliteus tendon is thickened and heterogeneous (series 8, image 10) with intramuscular edema within the popliteus muscle belly. No Baker's cyst. Extensor Mechanism: Intact quadriceps tendon and patellar tendon. Mild distal patellar tendinosis. Bones: No focal marrow signal abnormality. No fracture or dislocation. Other: Mild prepatellar soft tissue edema. Small ganglion posteromedial to the fibular head. IMPRESSION: 1. Focal radial tear at the posterior horn-body junction of the medial meniscus. Probable nondisplaced horizontal tear component involving the posterior horn. 2. Popliteus  tendon injury and muscle strain. 3. Moderate joint effusion. Electronically Signed   By: Duanne Guess D.O.   On: 04/06/2019 10:51   DG Knee Complete 4 Views Right  Result Date: 04/03/2019 CLINICAL DATA:  Acute RIGHT knee pain following fall several days ago. Initial encounter. EXAM: RIGHT KNEE - COMPLETE 4+ VIEW COMPARISON:  None. FINDINGS: No acute fracture, subluxation or dislocation identified. Minimal tricompartmental joint space narrowing/osteophytosis noted. There is no evidence of joint effusion. No focal bony lesions are present. Heavy vascular calcifications are identified. IMPRESSION: 1. No evidence  of acute abnormality. 2. Minimal tricompartmental degenerative changes. Electronically Signed   By: Harmon Pier M.D.   On: 04/03/2019 09:55   ECHOCARDIOGRAM COMPLETE  Result Date: 04/03/2019    ECHOCARDIOGRAM REPORT   Patient Name:   BACILIO ABASCAL Date of Exam: 04/03/2019 Medical Rec #:  [id number]        Height:       69.0 in Accession #:    [id number]       Weight:       175.0 lb Date of Birth:  [birth year]        BSA:          1.952 m Patient Age:    89 years         BP:           126/68 mmHg Patient Gender: M                HR:           120 bpm. Exam Location:  Inpatient Procedure: 2D Echo, Color Doppler and Cardiac Doppler STAT ECHO Indications:    I26.02 Pulmonary embolus  History:        Patient has no prior history of Echocardiogram examinations.  Sonographer:    Irving Burton Senior RDCS Referring Phys: [id number] Tomma Lightning  Sonographer Comments: Technically difficult study due to poor echo windows and irregular rhythm. IMPRESSIONS  1. Left ventricular ejection fraction, by estimation, is 40 to 45% with beat to beat variability. The left ventricle has mildly decreased function. Left ventricular endocardial border not optimally defined to evaluate regional wall motion. There is moderate left ventricular hypertrophy.  2. Right ventricular systolic function is mildly reduced. The right  ventricular size is normal. Tricuspid regurgitation signal is inadequate for assessing PA pressure.  3. Left atrial size was mildly dilated.  4. The mitral valve is normal in structure. Trivial mitral valve regurgitation. No evidence of mitral stenosis.  5. The aortic valve is abnormal. Aortic valve regurgitation is not visualized. No aortic stenosis is present.  6. Aortic dilatation noted. There is borderline dilatation of the ascending aorta measuring 39 mm.  7. The inferior vena cava is normal in size with greater than 50% respiratory variability, suggesting right atrial pressure of 3 mmHg. FINDINGS  Left Ventricle: Left ventricular ejection fraction, by estimation, is 40 to 45%. The left ventricle has mildly decreased function. Left ventricular endocardial border not optimally defined to evaluate regional wall motion. The left ventricular internal cavity size was normal in size. There is moderate left ventricular hypertrophy. Left ventricular diastolic parameters are indeterminate. Right Ventricle: The right ventricular size is normal. Right vetricular wall thickness was not assessed. Right ventricular systolic function is mildly reduced. Tricuspid regurgitation signal is inadequate for assessing PA pressure. Left Atrium: Left atrial size was mildly dilated. Right Atrium: Right atrial size was normal in size. Pericardium: There is no evidence of pericardial effusion. Mitral Valve: The mitral valve is normal in structure. Normal mobility of the mitral valve leaflets. Trivial mitral valve regurgitation. No evidence of mitral valve stenosis. Tricuspid Valve: The tricuspid valve is normal in structure. Tricuspid valve regurgitation is trivial. No evidence of tricuspid stenosis. Aortic Valve: The aortic valve is abnormal. Aortic valve regurgitation is not visualized. No aortic stenosis is present. There is mild calcification of the aortic valve. Pulmonic Valve: The pulmonic valve was normal in structure. Pulmonic  valve regurgitation is not visualized. No evidence of pulmonic stenosis. Aorta: Aortic dilatation noted. There is borderline  dilatation of the ascending aorta measuring 39 mm. Venous: The inferior vena cava is normal in size with greater than 50% respiratory variability, suggesting right atrial pressure of 3 mmHg. IAS/Shunts: No atrial level shunt detected by color flow Doppler.  LEFT VENTRICLE PLAX 2D LVIDd:         4.60 cm LVIDs:         3.80 cm LV PW:         1.40 cm LV IVS:        1.50 cm LVOT diam:     2.00 cm LV SV:         38 LV SV Index:   20 LVOT Area:     3.14 cm  RIGHT VENTRICLE RV S prime:     11.00 cm/s TAPSE (M-mode): 1.6 cm LEFT ATRIUM             Index       RIGHT ATRIUM           Index LA diam:        4.20 cm 2.15 cm/m  RA Area:     16.90 cm LA Vol (A2C):   68.4 ml 35.04 ml/m RA Volume:   36.20 ml  18.54 ml/m LA Vol (A4C):   75.3 ml 38.58 ml/m LA Biplane Vol: 73.8 ml 37.81 ml/m  AORTIC VALVE LVOT Vmax:   75.53 cm/s LVOT Vmean:  55.467 cm/s LVOT VTI:    0.122 m                          PULMONARY ARTERY AORTA                    MPA diam:        2.30 cm Ao Root diam: 2.70 cm Ao Asc diam:  3.70 cm  SHUNTS Systemic VTI:  0.12 m Systemic Diam: 2.00 cm Cherlynn Kaiser MD Electronically signed by Cherlynn Kaiser MD Signature Date/Time: 04/03/2019/3:13:54 PM    Final     Labs:  Basic Metabolic Panel: Recent Labs  Lab 04/12/19 0525  NA 139  K 3.8  CL 102  CO2 26  GLUCOSE 114*  BUN 17  CREATININE 1.02  CALCIUM 8.8*    CBC: Recent Labs  Lab 04/12/19 0525  WBC 9.4  NEUTROABS 6.2  HGB 12.1*  HCT 37.7*  MCV 92.2  PLT 312    CBG: No results for input(s): GLUCAP in the last 168 hours.  Family history.  Mother with hypertension.  Denies any CAD, diabetes mellitus colon cancer or esophageal cancer  Brief HPI:   TALLIS SOLEDAD is a 86 y.o. right-handed male with history of TIA as well as shingles on no prescription medications.  Lives with spouse independent prior to  admission.  He has a son in the area who can assist.  Presented 04/03/2019 with increasing shortness of breath and chest discomfort.  He recently had sustained a left foot strain for which she had a boot in place he tripped on the boot and fell March 7 without loss of consciousness.  He did strike the right side of his face.  Admission chemistries unremarkable, WBC 12,000, troponin high-sensitivity 369.  Cranial CT/MRI scan showed no evidence of intracranial abnormality.  CT cervical spine negative for fracture dislocation however there was a nondisplaced left zygomatic arch fracture.  CT angiogram of the chest positive for pulmonary embolus with findings worrisome for right heart strain.  Peripheral wedge-shaped opacity  in the left lower lobe worrisome for pulmonary infarct.  MRI of the right knee showed focal radial tear at the posterior horn body junction of the medial meniscus.  Probable nondisplaced horizontal tear component involving the posterior horn.  Popliteus tendon injury and muscle strain with moderate joint effusion.  Orthopedic services Dr. Constance Goltzlen follow-up conservative care weightbearing as tolerated.  Cardiology service follow-up for evaluation of possible atrial fibrillation RVR echocardiogram ejection fraction 45%.  His elevated troponin felt to be related to demand ischemia.  Patient was started on intravenous heparin transition to Eliquis for both pulmonary emboli and atrial fibrillation.  Patient was admitted for a comprehensive rehab program.   Hospital Course: Marline BackboneRichard H Fath was admitted to rehab 04/09/2019 for inpatient therapies to consist of PT, ST and OT at least three hours five days a week. Past admission physiatrist, therapy team and rehab RN have worked together to provide customized collaborative inpatient rehab.  Pertaining to patient's debility related to pulmonary emboli remained stable he continued on Eliquis therapy no bleeding episodes.  Pain managed with use of Tylenol.   Cardiac rate remained controlled he remained on beta-blocker with Lopressor he would follow-up with cardiology services no chest pain or shortness of breath reported.  Patient with recent fall 03/28/2019 prior to admission right knee focal radial tear at the posterior horn body junction of the medial meniscus weightbearing as tolerated neurovascular sensation intact he would follow-up with Dr. Constance Goltzlen of orthopedic services.  Nondisplaced left zygomatic arch fracture conservative care.  Patient did have bouts of constipation no nausea or vomiting or bowel program adjusted   Blood pressures were monitored on TID basis and stable   He/ is continent of bowel and bladder.  He/ has made gains during rehab stay and is attending therapies  He/ will continue to receive follow up therapies   after discharge  Rehab course: During patient's stay in rehab weekly team conferences were held to monitor patient's progress, set goals and discuss barriers to discharge. At admission, patient required min mod assist 14 feet rolling walker, minimal assist sit to stand, moderate assist sit to side-lying moderate assist sit to supine.  Min mod assist ADLs  Physical exam.  Blood pressure 118/85 pulse 89 temperature 98.1 respirations 18 oxygen saturation 97% room air General.  Alert and oriented no apparent distress HEENT.  Head normocephalic and atraumatic Eyes.  Pupils round and reactive to light no discharge without nystagmus Neck.  Supple nontender no JVD without thyromegaly Cardiac regular rate rhythm without any extra sounds or murmur heard Abdomen.  Soft nontender positive bowel sounds without rebound Respiratory effort normal no respiratory distress without wheeze Extremities.  +2 pulses Neuro/musculoskeletal.  Alert pleasant no distress.  Strength diffusely 4 out of 5 with exception of right lower extremity limited by pain.  He/  has had improvement in activity tolerance, balance, postural control as well as  ability to compensate for deficits. He/ has had improvement in functional use RUE/LUE  and RLE/LLE as well as improvement in awareness.  Sessions focused on functional mobility transfers dynamic standing balance and ambulation.  Patient transferred stand pivot bed to wheelchair without assistive device minimal assistance.  Propels wheelchair supervision.  Ambulates 160 feet rolling walker contact guard.  Gather his belongings for activities day living and homemaking.  Full family teaching completed plan discharge to home       Disposition: Discharge to home    Diet: Regular  Special Instructions: No driving smoking or alcohol  Weightbearing as  tolerated  Medications at discharge. 1.  Tylenol as needed 2.  Eliquis 5 mg p.o. twice daily 3.  Xalatan ophthalmic solution 1 drop both eyes bedtime 4.  Lopressor 100 mg p.o. twice daily  Discharge Instructions     Ambulatory referral to Physical Medicine Rehab   Complete by: As directed    Follow-up 1 month debility/pulmonary emboli       Follow-up Information     Durene Romans, MD Follow up.   Specialty: Orthopedic Surgery Why: Call for appointment Contact information: [address] [place] [address] [phone]         Lewayne Bunting, MD Follow up.   Specialty: Cardiology Why: Call for appointment Contact information: [address] [place] [address] [phone]         Marcello Fennel, MD Follow up.   Specialty: Physical Medicine and Rehabilitation Why: As directed Contact information: [address] [place] [address] [phone]            Signed: Charlton Amor 04/16/2019, 5:17 AM Patient was seen, face-face, and physical exam performed by me on day of discharge, less than 30 minutes of total time spent.. Please see progress note from day of discharge as well.  Maryla Morrow, MD, ABPMR

## 2019-04-15 NOTE — Progress Notes (Signed)
Occupational Therapy Session Note  Patient Details  Name: Willie Cisneros MRN: [id number] Date of Birth: 1926-09-24  Today's Date: 04/15/2019 OT Individual Time: 1015-1045 OT Individual Time Calculation (min): 30 min    Short Term Goals: Week 1:  OT Short Term Goal 1 (Week 1): Pt will tolerate standing for 2 grooming tasks at the sink OT Short Term Goal 2 (Week 1): Pt will thread LB clothing with setup OT Short Term Goal 3 (Week 1): Pt will don post op shoe on left and another regular shoe on the right with min A OT Short Term Goal 4 (Week 1): Pt will perform 3/3 toileting tasks with mod A  Skilled Therapeutic Interventions/Progress Updates:    1:1 Pt sitting in w/c when arrived. Pt ambulated to the sink with distant supervision. Engaged in shaving his beard in standing position. Pt tolerated standing at the sink with supervision for shaving. Due to beard being so thick pt had to stand for ~20 min . Pt tolerated it well with no difficulty. Returned to laying to rest in the bed mod I.   2nd session 1:1 2-3pm.  Pt participated in self care retraining at shower level. Pt able to ambulated to the bathroom with RW with supervision. Pt showered sit to stand with distant supervision to mod I. Pt donned a gown and ambulated out into the room. Pt able to don underwear and brief with setup of supplies. Donned TEDS but demonstrated bag technique- pt reported to try tomorrow. Pt donned shoes mod I and can perform basic transfers mod I.   Therapy Documentation Precautions:  Precautions Precautions: Fall Precaution Comments: WBAT Required Braces or Orthoses: Other Brace Other Brace: post op boot on left foot Restrictions Weight Bearing Restrictions: No Pain:  no c/o pain in session   Therapy/Group: Individual Therapy  [facility] 04/15/2019, 3:32 PM

## 2019-04-15 NOTE — Progress Notes (Signed)
Physical Therapy Session Note  Patient Details  Name: Willie Cisneros MRN: [id number] Date of Birth: August 14, 1926  Today's Date: 04/15/2019 PT Individual Time: 7858-8502 PT Individual Time Calculation (min): 42 min   Short Term Goals: Week 1:  PT Short Term Goal 1 (Week 1): Pt will perform bed mobility with supervision PT Short Term Goal 2 (Week 1): Pt will perform bed<>chair transfer with LRAD min A PT Short Term Goal 3 (Week 1): Pt will perform car transfer with LRAD min A  Skilled Therapeutic Interventions/Progress Updates:   Received pt supine in bed, pt agreeable to therapy, and denied any pain during session. Session focused on discharge planning, functional mobility/transfers, ambulation, LE strength, dynamic standing balance/coordination, and improved activity tolerance. Pt transferred supine<>sitting EOB independently with increased time. Pt donned shoes independently. While sitting EOB without UE support discussed discharge plan including equipment, medication schedule, methods for contacting physicians upon discharge, and therapy schedule at home. Assessed current strength, sensation, and coordination while sitting EOB. Pt transferred sit<>stand with RW and supervision from low bed surface and ambulated 13ft with RW and supervision to ortho gym. While standing pt performed bilateral UE strengthening on UBE at level 2 for 4 minutes forwards and 4 minutes backwards with supervision. Pt ambulated 176ft with RW and supervision back to room. Concluded session with pt sitting EOB, needs within reach, and bed alarm on.   Therapy Documentation Precautions:  Precautions Precautions: Fall Precaution Comments: WBAT Required Braces or Orthoses: Other Brace Other Brace: post op boot on left foot Restrictions Weight Bearing Restrictions: No  Therapy/Group: Individual Therapy Martin Majestic PT, DPT   04/15/2019, 7:33 AM

## 2019-04-15 NOTE — Progress Notes (Signed)
Team Conference Report to Patient/Family  Team Conference discussion was reviewed with the patient and he called son during the conversation and updated him, including goals, any changes in plan of care and target discharge date.  Patient and son expressed understanding and are in agreement.  The patient has a target discharge date of 04/16/19. Son to come in for family education 04/16/19 9a-12n and then patient will be discharged home.No equipment recommended. Patient will be supervision at discharge. He has been given information about private duty and has set up [facility] care with one of the agencies. All set for discharge.  Chana Bode B 04/15/2019, 4:25 PM

## 2019-04-15 NOTE — Telephone Encounter (Signed)
**Note De-identified Willie Cisneros Obfuscation** The pt is being discharged today from the hospital. We will call him tomorrow. 

## 2019-04-15 NOTE — Progress Notes (Signed)
Physical Therapy Session Note  Patient Details  Name: Willie Cisneros MRN: [id number] Date of Birth: 25-Feb-1926  Today's Date: 04/15/2019 PT Individual Time: 0815-0915 PT Individual Time Calculation (min): 60 min   Short Term Goals: Week 1:  PT Short Term Goal 1 (Week 1): Pt will perform bed mobility with supervision PT Short Term Goal 2 (Week 1): Pt will perform bed<>chair transfer with LRAD min A PT Short Term Goal 3 (Week 1): Pt will perform car transfer with LRAD min A Week 2:    Week 3:     Skilled Therapeutic Interventions/Progress Updates:  Pt on commode having BM delaying session by 15 min.    PAIN Denies pain this am.  Pt initially standing at commode w/nursing completing toileting.  Gait 52ft w/RW to bed including negotiation of threshold w/supervision.  Transfers to edge of bed w/rw w/supervision.  In sitting pt able to don shorts w/supervision, STS w/supervision and raises shorts w/supervision.  Bed to wc w/supervision.  Pt transported to gym for continued session w/focus on functional mobility.    Car transfer:  Pt performs w/supervision using RW.  STS from wc w/supervision, gait 49ft w/supervision, car transfer completed w/supervision, gait 86ft to wc w/supervision, turn/sit to wc w/supervision.  Gait 34ft on uneven surfaces:  Performed w/RW w/close supervision, moves slowly and cautiously, lifts walker gently thru mulch vs attempting to roll, demonstrates no balance issues w/this approach.  Stairs:  Ascends/descends 12 stairs w/2 rails step over step ascending, step to descending, cga for safety.  Gait:  714ft w/RW and close supervision, self cues for posture.  Pt w/age related flexed posture, moves w/good cadence for age.  Picking up object from floor:  perfoms w/single hand on RW and cga, moves cautiously and safely.  Bed mobility: supervision w/all  Supine to/from sit supervision  Bed to/from wc w/supervision.  Pt left seated on edge of bed w/bed alarm set,  nursing providing meds.    Therapy Documentation Precautions:  Precautions Precautions: Fall Precaution Comments: WBAT Required Braces or Orthoses: Other Brace Other Brace: post op boot on left foot Restrictions Weight Bearing Restrictions: No   Therapy/Group: Individual Therapy  Rada Hay, PT   Shearon Balo 04/15/2019, 2:58 PM

## 2019-04-16 ENCOUNTER — Ambulatory Visit (HOSPITAL_COMMUNITY): Payer: Medicare Other

## 2019-04-16 ENCOUNTER — Encounter (HOSPITAL_COMMUNITY): Payer: Medicare Other | Admitting: Occupational Therapy

## 2019-04-16 LAB — CBC WITH DIFFERENTIAL/PLATELET
Abs Immature Granulocytes: 0.05 10*3/uL (ref 0.00–0.07)
Basophils Absolute: 0 10*3/uL (ref 0.0–0.1)
Basophils Relative: 0 %
Eosinophils Absolute: 0.2 10*3/uL (ref 0.0–0.5)
Eosinophils Relative: 3 %
HCT: 35.7 % — ABNORMAL LOW (ref 39.0–52.0)
Hemoglobin: 11.5 g/dL — ABNORMAL LOW (ref 13.0–17.0)
Immature Granulocytes: 1 %
Lymphocytes Relative: 31 %
Lymphs Abs: 2.1 10*3/uL (ref 0.7–4.0)
MCH: 29.7 pg (ref 26.0–34.0)
MCHC: 32.2 g/dL (ref 30.0–36.0)
MCV: 92.2 fL (ref 80.0–100.0)
Monocytes Absolute: 0.5 10*3/uL (ref 0.1–1.0)
Monocytes Relative: 7 %
Neutro Abs: 3.8 10*3/uL (ref 1.7–7.7)
Neutrophils Relative %: 58 %
Platelets: 430 10*3/uL — ABNORMAL HIGH (ref 150–400)
RBC: 3.87 MIL/uL — ABNORMAL LOW (ref 4.22–5.81)
RDW: 13.3 % (ref 11.5–15.5)
WBC: 6.7 10*3/uL (ref 4.0–10.5)
nRBC: 0 % (ref 0.0–0.2)

## 2019-04-16 LAB — BASIC METABOLIC PANEL
Anion gap: 10 (ref 5–15)
BUN: 23 mg/dL (ref 8–23)
CO2: 23 mmol/L (ref 22–32)
Calcium: 8.7 mg/dL — ABNORMAL LOW (ref 8.9–10.3)
Chloride: 106 mmol/L (ref 98–111)
Creatinine, Ser: 1.11 mg/dL (ref 0.61–1.24)
GFR calc Af Amer: >60 mL/min (ref >60)
GFR calc non Af Amer: 58 mL/min — ABNORMAL LOW (ref >60)
Glucose, Bld: 108 mg/dL — ABNORMAL HIGH (ref 70–99)
Potassium: 4.2 mmol/L (ref 3.5–5.1)
Sodium: 139 mmol/L (ref 135–145)

## 2019-04-16 NOTE — Plan of Care (Signed)
  Problem: Consults Goal: RH GENERAL PATIENT EDUCATION Description: See Patient Education module for education specifics. Outcome: Completed/Met   Problem: RH BOWEL ELIMINATION Goal: RH STG MANAGE BOWEL WITH ASSISTANCE Description: STG Manage Bowel with Mod I Assistance. Outcome: Completed/Met   Problem: RH SAFETY Goal: RH STG ADHERE TO SAFETY PRECAUTIONS W/ASSISTANCE/DEVICE Description: STG Adhere to Safety Precautions With Mod I Assistance/Device. Outcome: Completed/Met   Problem: RH PAIN MANAGEMENT Goal: RH STG PAIN MANAGED AT OR BELOW PT'S PAIN GOAL Description: Less than 4  Outcome: Completed/Met   Problem: RH KNOWLEDGE DEFICIT GENERAL Goal: RH STG INCREASE KNOWLEDGE OF SELF CARE AFTER HOSPITALIZATION Description: Remain free of injuries, prevent falls with mod I assist Outcome: Completed/Met

## 2019-04-16 NOTE — Discharge Instructions (Addendum)
Inpatient Rehab Discharge Instructions  Willie Cisneros Discharge date and time: No discharge date for patient encounter.   Activities/Precautions/ Functional Status: Activity: activity as tolerated Diet: regular diet Wound Care: none needed Functional status:  ___ No restrictions     ___ Walk up steps independently ___ 24/7 supervision/assistance   ___ Walk up steps with assistance ___ Intermittent supervision/assistance  ___ Bathe/dress independently ___ Walk with walker     _x__ Bathe/dress with assistance ___ Walk Independently    ___ Shower independently ___ Walk with assistance    ___ Shower with assistance ___ No alcohol     ___ Return to work/school ________   COMMUNITY REFERRALS UPON DISCHARGE:  Home Health: PT, OT   Agency:[facility] Phone:[phone]   Special Instructions: No driving smoking or alcohol   My questions have been answered and I understand these instructions. I will adhere to these goals and the provided educational materials after my discharge from the hospital.  Patient/Caregiver Signature _______________________________ Date __________  Clinician Signature _______________________________________ Date __________  Please bring this form and your medication list with you to all your follow-up doctor's appointments.  ===================================================== Information on my medicine - ELIQUIS (apixaban)  This medication education was reviewed with me or my healthcare representative as part of my discharge preparation.   Why was Eliquis prescribed for you? Eliquis was prescribed to treat blood clots that may have been found in the veins of your legs (deep vein thrombosis) or in your lungs (pulmonary embolism) and to reduce the risk of them occurring again.  What do You need to know about Eliquis ? The dose is ONE 5 mg tablet taken TWICE daily.  Eliquis may be taken with or without food.   Try to take the dose about the same  time in the morning and in the evening. If you have difficulty swallowing the tablet whole please discuss with your pharmacist how to take the medication safely.  Take Eliquis exactly as prescribed and DO NOT stop taking Eliquis without talking to the doctor who prescribed the medication.  Stopping may increase your risk of developing a new blood clot.  Refill your prescription before you run out.  After discharge, you should have regular check-up appointments with your healthcare provider that is prescribing your Eliquis.    What do you do if you miss a dose? If a dose of ELIQUIS is not taken at the scheduled time, take it as soon as possible on the same day and twice-daily administration should be resumed. The dose should not be doubled to make up for a missed dose.  Important Safety Information A possible side effect of Eliquis is bleeding. You should call your healthcare provider right away if you experience any of the following: ? Bleeding from an injury or your nose that does not stop. ? Unusual colored urine (red or dark brown) or unusual colored stools (red or black). ? Unusual bruising for unknown reasons. ? A serious fall or if you hit your head (even if there is no bleeding).  Some medicines may interact with Eliquis and might increase your risk of bleeding or clotting while on Eliquis. To help avoid this, consult your healthcare provider or pharmacist prior to using any new prescription or non-prescription medications, including herbals, vitamins, non-steroidal anti-inflammatory drugs (NSAIDs) and supplements.  This website has more information on Eliquis (apixaban): http://www.eliquis.com/eliquis/home ============================================== Pulmonary Embolism    A pulmonary embolism (PE) is a sudden blockage or decrease of blood flow in one or both  lungs. Most blockages come from a blood clot that forms in the vein of a lower leg, thigh, or arm (deep vein  thrombosis, DVT) and travels to the lungs. A clot is blood that has thickened into a gel or solid. PE is a dangerous and life-threatening condition that needs to be treated right away.  What are the causes? This condition is usually caused by a blood clot that forms in a vein and moves to the lungs. In rare cases, it may be caused by air, fat, part of a tumor, or other tissue that moves through the veins and into the lungs.  What increases the risk? The following factors may make you more likely to develop this condition: 1. Experiencing a traumatic injury, such as breaking a hip or leg. 2. Having: ? A spinal cord injury. ? Orthopedic surgery, especially hip or knee replacement. ? Any major surgery. ? A stroke. ? DVT. ? Blood clots or blood clotting disease. ? Long-term (chronic) lung or heart disease. ? Cancer treated with chemotherapy. ? A central venous catheter. 3. Taking medicines that contain estrogen. These include birth control pills and hormone replacement therapy. 4. Being: ? Pregnant. ? In the period of time after your baby is delivered (postpartum). ? Older than age 43. ? Overweight. ? A smoker, especially if you have other risks.  What are the signs or symptoms? Symptoms of this condition usually start suddenly and include:  Shortness of breath during activity or at rest.  Coughing, coughing up blood, or coughing up blood-tinged mucus.  Chest pain that is often worse with deep breaths.  Rapid or irregular heartbeat.  Feeling light-headed or dizzy.  Fainting.  Feeling anxious.  Fever.  Sweating.  Pain and swelling in a leg. This is a symptom of DVT, which can lead to PE. How is this diagnosed? This condition may be diagnosed based on:  Your medical history.  A physical exam.  Blood tests.  CT pulmonary angiogram. This test checks blood flow in and around your lungs.  Ventilation-perfusion scan, also called a lung VQ scan. This test measures air  flow and blood flow to the lungs.  An ultrasound of the legs.  How is this treated? Treatment for this condition depends on many factors, such as the cause of your PE, your risk for bleeding or developing more clots, and other medical conditions you have. Treatment aims to remove, dissolve, or stop blood clots from forming or growing larger. Treatment may include: 1. Medicines, such as: ? Blood thinning medicines (anticoagulants) to stop clots from forming. ? Medicines that dissolve clots (thrombolytics). 2. Procedures, such as: ? Using a flexible tube to remove a blood clot (embolectomy) or to deliver medicine to destroy it (catheter-directed thrombolysis). ? Inserting a filter into a large vein that carries blood to the heart (inferior vena cava). This filter (vena cava filter) catches blood clots before they reach the lungs. ? Surgery to remove the clot (surgical embolectomy). This is rare. You may need a combination of immediate, long-term (up to 3 months after diagnosis), and extended (more than 3 months after diagnosis) treatments. Your treatment may continue for several months (maintenance therapy). You and your health care provider will work together to choose the treatment program that is best for you.  Follow these instructions at home: Medicines 1. Take over-the-counter and prescription medicines only as told by your health care provider. 2. If you are taking an anticoagulant medicine: ? Take the medicine every day at the same  time each day. ? Understand what foods and drugs interact with your medicine. ? Understand the side effects of this medicine, including excessive bruising or bleeding. Ask your health care provider or pharmacist about other side effects.  General instructions  Wear a medical alert bracelet or carry a medical alert card that says you have had a PE and lists what medicines you take.  Ask your health care provider when you may return to your normal  activities. Avoid sitting or lying for a long time without moving.  Maintain a healthy weight. Ask your health care provider what weight is healthy for you.  Do not use any products that contain nicotine or tobacco, such as cigarettes, e-cigarettes, and chewing tobacco. If you need help quitting, ask your health care provider.  Talk with your health care provider about any travel plans. It is important to make sure that you are still able to take your medicine while on trips.  Keep all follow-up visits as told by your health care provider. This is important.  Contact a health care provider if:  You missed a dose of your blood thinner medicine.  Get help right away if: 1. You have: ? New or increased pain, swelling, warmth, or redness in an arm or leg. ? Numbness or tingling in an arm or leg. ? Shortness of breath during activity or at rest. ? A fever. ? Chest pain. ? A rapid or irregular heartbeat. ? A severe headache. ? Vision changes. ? A serious fall or accident, or you hit your head. ? Stomach (abdominal) pain. ? Blood in your vomit, stool, or urine. ? A cut that will not stop bleeding. 2. You cough up blood. 3. You feel light-headed or dizzy. 4. You cannot move your arms or legs. 5. You are confused or have memory loss.  These symptoms may represent a serious problem that is an emergency. Do not wait to see if the symptoms will go away. Get medical help right away. Call your local emergency services (911 in the [place] Do not drive yourself to the hospital. Summary  A pulmonary embolism (PE) is a sudden blockage or decrease of blood flow in one or both lungs. PE is a dangerous and life-threatening condition that needs to be treated right away.  Treatments for this condition usually include medicines to thin your blood (anticoagulants) or medicines to break apart blood clots (thrombolytics).  If you are given blood thinners, it is important to take the medicine every day at  the same time each day.  Understand what foods and drugs interact with any medicines that you are taking.  If you have signs of PE or DVT, call your local emergency services (911 in the [place] This information is not intended to replace advice given to you by your health care provider. Make sure you discuss any questions you have with your health care provider. Document Revised: 10/15/2017 Document Reviewed: 10/15/2017 Elsevier Patient Education  2020 [employer].

## 2019-04-16 NOTE — Progress Notes (Signed)
Occupational Therapy Discharge Summary  Patient Details  Name: Willie Cisneros MRN: [id number] Date of Birth: [birth year]  Patient has met 9 of 9 long term goals due to improved activity tolerance, improved balance, postural control, ability to compensate for deficits and improved awareness.  Patient to discharge at overall Mod I overall, Supervision shower and shower transfers level.  Patient's care partner is independent to provide the necessary intermittent assistance at discharge.  Pt's son is available to provide assist as needs, family is also to hire personal PT and caregiver to assist with pt and wife in home.  Reasons goals not met: N/A  Recommendation:  Patient will not require follow up OT at this time.  Pt plans to hire personal PT and caregiver to assist with pt and wife in home.  Equipment: No equipment provided  Reasons for discharge: treatment goals met and discharge from hospital  Patient/family agrees with progress made and goals achieved: Yes  OT Discharge Precautions/Restrictions  Precautions Precautions: Fall Precaution Comments: WBAT Restrictions Weight Bearing Restrictions: No General   Vital Signs Therapy Vitals Pulse Rate: 60 BP: 133/60 Patient Position (if appropriate): Sitting Pain Pain Assessment Pain Scale: 0-10 Pain Score: 0-No pain ADL ADL Eating: Independent Grooming: Modified independent Where Assessed-Grooming: Sitting at sink, Standing at sink Upper Body Bathing: Setup Where Assessed-Upper Body Bathing: Shower Lower Body Bathing: Setup Where Assessed-Lower Body Bathing: Shower Upper Body Dressing: Independent Where Assessed-Upper Body Dressing: Edge of bed Lower Body Dressing: Setup Where Assessed-Lower Body Dressing: Edge of bed, Chair Toileting: Modified independent Where Assessed-Toileting: Toilet, Bedside Commode Toilet Transfer: Modified independent Armed forces technical officer Method: Counselling psychologist: Sales promotion account executive: Distant supervision Social research officer, government Method: Heritage manager: Civil engineer, contracting with back, Grab bars Vision Baseline Vision/History: No visual deficits Patient Visual Report: No change from baseline Vision Assessment?: No apparent visual deficits Perception  Perception: Within Functional Limits Praxis Praxis: Intact Cognition Overall Cognitive Status: Within Functional Limits for tasks assessed Arousal/Alertness: Awake/alert Orientation Level: Oriented X4 Attention: Selective Selective Attention: Appears intact Memory: Appears intact Awareness: Appears intact Problem Solving: Appears intact Safety/Judgment: Appears intact Sensation Sensation Light Touch: Appears Intact Hot/Cold: Appears Intact Proprioception: Appears Intact Coordination Gross Motor Movements are Fluid and Coordinated: No Fine Motor Movements are Fluid and Coordinated: Yes Coordination and Movement Description: grossly uncoordinated due to bilateral LE weakness and decreased postural control, however able to compensate well with RW Finger Nose Finger Test: [facility] Extremity/Trunk Assessment RUE Assessment General Strength Comments: reports had a RTC tear and unable to lift UE to 90 degrees and difficulty with shoulder adduction to reach other shoulder functionally (unable to wash or don deordant under left UE) strength WFL LUE Assessment LUE Assessment: Within Functional Limits   Willie Cisneros 04/16/2019, 12:45 PM

## 2019-04-16 NOTE — Progress Notes (Signed)
Patient discharge instructions reviewed with patient and son by Jesusita Oka PA. Patient denied pain. Patient transported  Via wheelchair by nurse tech. Trasported to personal vehicle for discharge home with son.

## 2019-04-16 NOTE — Progress Notes (Signed)
Occupational Therapy Session Note  Patient Details  Name: Willie Cisneros MRN: [id number] Date of Birth: 1926/07/21  Today's Date: 04/16/2019 OT Individual Time: 1002-1048 OT Individual Time Calculation (min): 46 min    Short Term Goals: Week 1:  OT Short Term Goal 1 (Week 1): Pt will tolerate standing for 2 grooming tasks at the sink OT Short Term Goal 2 (Week 1): Pt will thread LB clothing with setup OT Short Term Goal 3 (Week 1): Pt will don post op shoe on left and another regular shoe on the right with min A OT Short Term Goal 4 (Week 1): Pt will perform 3/3 toileting tasks with mod A  Skilled Therapeutic Interventions/Progress Updates:    Completed OT treatment session with focus on family education with pt and pt's son, Raiford Noble.  Pt received upright in w/c reporting having shower during PM OT session yesterday and declining bathing this session.  Pt reports dressing independently this AM from EOB.  Educated pt and pt's son on techniques for donning TEDS, pt's son asking about different varieties.  Encouraged pt and pt's son to speak with pt's podiatrist for recommendations if plans to purchase alternative compression stockings/socks.  Pt ambulated 150' to ADL apt with RW Mod I.  Engaged in simulated walk-in shower transfer and toilet transfers with RW.  Educated on recommendation for supervision with bathing at shower level at shower transfers.  Pt completed furniture transfers from couch with increased time, but no physical assistance.  Provided pt with walker bag to increase safety with RW management while transporting items.  Discussed use of walker bag and counter tops in kitchen during meal prep tasks.  Pt and pt's son report no further questions, ready for d/c.  Therapy Documentation Precautions:  Precautions Precautions: Fall Precaution Comments: WBAT Required Braces or Orthoses: Other Brace Other Brace: post op boot on left foot Restrictions Weight Bearing Restrictions:  No General:   Vital Signs: Therapy Vitals Pulse Rate: 60 BP: 133/60 Patient Position (if appropriate): Sitting Pain: Pain Assessment Pain Scale: 0-10 Pain Score: 0-No pain   Therapy/Group: Individual Therapy  Rosalio Loud 04/16/2019, 12:37 PM

## 2019-04-16 NOTE — Telephone Encounter (Signed)
**Note De-Identified Willie Cisneros Obfuscation** The pt was discharged today from [facility], [facility], [facility]. I have advised him that I will call him back on Monday to do nthis TCM Call.  I also gave him the phone number to call [facility] pt asst program to discuss applying for pt asst for his Eliquis which he states his son just paid $600/30 day supply. He is aware to ask that they mail him an application.

## 2019-04-16 NOTE — Progress Notes (Signed)
Physical Therapy Discharge Summary  Patient Details  Name: Willie Cisneros MRN: [id number] Date of Birth: 18-Dec-1926  Today's Date: 04/16/2019 PT Individual Time: 4315-4008 PT Individual Time Calculation (min): 40 min   Patient has met 11 of 11 long term goals due to improved activity tolerance, improved balance, improved postural control, increased strength and improved coordination. Patient to discharge at an ambulatory level Supervision. Patient's care partner is independent to provide the necessary physical assistance at discharge. Pt's son Willie Cisneros attended family education training on 3/26 and verbalized and demonstrated confidence with all tasks to ensure safe discharge home.   All goals met   Recommendation:  Patient will benefit from ongoing skilled PT services in home health setting to continue to advance safe functional mobility, address ongoing impairments in transfers, ambulation, LE strength, dynamic standing balance/coordination, endurance, and to minimize fall risk.  Equipment: No equipment provided  Reasons for discharge: treatment goals met and discharge from hospital  Patient/family agrees with progress made and goals achieved: Yes  Today's Interventions: Received pt sitting in WC, pt agreeable to therapy, and denied any pain during session. Session focused on discharge planning, functional mobility/transfers, LE strength, dynamic standing balance/coordination, ambulation, simulated car transfers, stair navigation, and improved endurance with activity. Pt transferred sit<>stand mod I with RW and ambulated 179f to therapy gym with RW and supervision. Pt performed ambulatory simulated car transfer with RW and supervision. Pt ambulated 187fon uneven surfaces (ramp and mulch) with RW and supervision. Pt ambulated additional 7589fith RW and supervision to gym and navigated 8 6in steps with 2 rails with supervision ascending with a step through pattern and descending with a step  to pattern. Pt/pt's son educated on strategies to ensure safety while on stairs and verbalized understanding. Therapist educated pt/pt's son on importance of using RW for mobility for safety and energy conservation purposes. Pt ambulated [id number] RW to rehab apartment and performed furniture transfer on recliner with RW and supervision. Pt ambulated 133ft41fh RW and supervision back to room. Concluded session with pt sitting in WC, needs within reach, and chair pad alarm on. Pt/pt's son verbalized confidence with all tasks to ensure safe discharge home.   PT Discharge Precautions/Restrictions Precautions Precautions: Fall Precaution Comments: WBAT Restrictions Weight Bearing Restrictions: No Cognition Overall Cognitive Status: Within Functional Limits for tasks assessed Arousal/Alertness: Awake/alert Orientation Level: Oriented X4 Memory: Appears intact Awareness: Appears intact Problem Solving: Appears intact Safety/Judgment: Appears intact Sensation Sensation Light Touch: Appears Intact Proprioception: Appears Intact Additional Comments: WFL Coordination Gross Motor Movements are Fluid and Coordinated: No Fine Motor Movements are Fluid and Coordinated: Yes Coordination and Movement Description: grossly uncoordinated due to bilateral LE weakness and decreased postural control, however able to compensate well with RW Finger Nose Finger Test: WFL [facility] Test: [facility]  Motor Motor: Abnormal postural alignment and control Motor - Skilled Clinical Observations: grossly uncoordinated due to bilateral LE weakness and decreased postural control, however able to compensate well with RW  Mobility Bed Mobility Bed Mobility: Rolling Right;Rolling Left Rolling Right: Independent Rolling Left: Independent Supine to Sit: Independent Transfers Transfers: Sit to Stand;Stand to Sit;Stand Pivot Transfers Sit to Stand: Independent with assistive device(RW) Stand to Sit: Independent with  assistive device(RW) Stand Pivot Transfers: Independent with assistive device(RW) Transfer (Assistive device): Rolling walker Locomotion  Gait Ambulation: Yes Gait Assistance: Supervision/Verbal cueing Gait Distance (Feet): 150 Feet Assistive device: Rolling walker Gait Assistance Details: Verbal cues for precautions/safety;Verbal cues for safe use of DME/AE Gait Assistance Details:  verbal cues for RW safety and to decrease cadence Gait Gait: Yes Gait Pattern: Impaired Gait Pattern: Decreased trunk rotation;Wide base of support;Decreased stride length;Trunk flexed Gait velocity: decreased Stairs / Additional Locomotion Stairs: Yes Stairs Assistance: Supervision/Verbal cueing Stair Management Technique: Two rails Number of Stairs: 12 Height of Stairs: 6 Ramp: Supervision/Verbal cueing(RW) Curb: Supervision/Verbal Engineer, manufacturing: Yes Wheelchair Assistance: Independent with Camera operator: Both upper extremities Wheelchair Parts Management: Supervision/cueing Distance: 19f  Trunk/Postural Assessment  Cervical Assessment Cervical Assessment: Exceptions to WFL(forward head) Thoracic Assessment Thoracic Assessment: Exceptions to WFL(kyphosis) Lumbar Assessment Lumbar Assessment: Exceptions to WFL(posterior pelvic tilt in sitting) Postural Control Postural Control: Deficits on evaluation  Balance Balance Balance Assessed: Yes Static Sitting Balance Static Sitting - Balance Support: No upper extremity supported Static Sitting - Level of Assistance: 7: Independent Dynamic Sitting Balance Dynamic Sitting - Balance Support: No upper extremity supported Dynamic Sitting - Level of Assistance: 7: Independent Static Standing Balance Static Standing - Balance Support: Bilateral upper extremity supported(RW) Static Standing - Level of Assistance: 5: Stand by assistance(supervision) Dynamic Standing Balance Dynamic Standing -  Balance Support: Bilateral upper extremity supported(RW) Dynamic Standing - Level of Assistance: 5: Stand by assistance(supervision) Extremity Assessment  RLE Assessment RLE Assessment: Exceptions to W[facility] Inc[facility] Strength Comments: grossly generalized to 4/5 LLE Assessment LLE Assessment: Exceptions to WPott[facility] CenterGeneral Strength Comments: grossly generalized to 4/5  Torrez Renfroe M Aarsh Fristoe Marcine Gadway PT, DPT  04/16/2019, 7:55 AM

## 2019-04-16 NOTE — Progress Notes (Signed)
[place] PHYSICAL MEDICINE & REHABILITATION PROGRESS NOTE   Subjective/Complaints: Patient seen sitting up in bed this AM.  He states he slept well overnight.  He is ready for discharge today.   ROS: Denies CP. SOB, N/V/D  Objective:   No results found. Recent Labs    04/16/19 0542  WBC 6.7  HGB 11.5*  HCT 35.7*  PLT 430*   Recent Labs    04/16/19 0542  NA 139  K 4.2  CL 106  CO2 23  GLUCOSE 108*  BUN 23  CREATININE 1.11  CALCIUM 8.7*    Intake/Output Summary (Last 24 hours) at 04/16/2019 0855 Last data filed at 04/15/2019 2200 Gross per 24 hour  Intake 540 ml  Output --  Net 540 ml     Physical Exam: Vital Signs Blood pressure 133/60, pulse 60, temperature 98 F (36.7 C), temperature source Oral, resp. rate 16, height 5\' 10"  (1.778 m), weight 78 kg, SpO2 97 %. Constitutional: No distress . Vital signs reviewed. HENT: Normocephalic.  Atraumatic. Facial ecchymosis.  Eyes: EOMI. No discharge. Cardiovascular: No JVD. Respiratory: Normal effort.  No stridor. GI: Non-distended. Skin: Right knee with dressing over patella C/D/I. Psych: Normal mood.  Normal behavior. Musc: Lower extremity edema Neurologic: Alert Motor: 4+-5/5 throughout, stable  Assessment/Plan: 1. Functional deficits secondary to debility  which require 3+ hours per day of interdisciplinary therapy in a comprehensive inpatient rehab setting.  Physiatrist is providing close team supervision and 24 hour management of active medical problems listed below.  Physiatrist and rehab team continue to assess barriers to discharge/monitor patient progress toward functional and medical goals  Care Tool:  Bathing    Body parts bathed by patient: Right arm, Chest, Abdomen, Front perineal area, Right upper leg, Left upper leg, Right lower leg, Left lower leg, Face, Buttocks, Left arm   Body parts bathed by helper: Left arm, Buttocks     Bathing assist Assist Level: Supervision/Verbal cueing      Upper Body Dressing/Undressing Upper body dressing   What is the patient wearing?: Pull over shirt    Upper body assist Assist Level: Set up assist    Lower Body Dressing/Undressing Lower body dressing      What is the patient wearing?: Incontinence brief, Pants     Lower body assist Assist for lower body dressing: Set up assist     Toileting Toileting    Toileting assist Assist for toileting: Minimal Assistance - Patient > 75% Assistive Device Comment: urinal   Transfers Chair/bed transfer  Transfers assist     Chair/bed transfer assist level: Supervision/Verbal cueing     Locomotion Ambulation   Ambulation assist      Assist level: Supervision/Verbal cueing Assistive device: Walker-rolling Max distance: 770ft   Walk 10 feet activity   Assist  Walk 10 feet activity did not occur: Safety/medical concerns(bilateral LE weakness, decreased postural control)  Assist level: Supervision/Verbal cueing Assistive device: Walker-rolling   Walk 50 feet activity   Assist Walk 50 feet with 2 turns activity did not occur: Safety/medical concerns(fatigue, bilateral LE weakness, decreased postural control)  Assist level: Supervision/Verbal cueing Assistive device: Walker-rolling    Walk 150 feet activity   Assist Walk 150 feet activity did not occur: Safety/medical concerns(fatigue, bilateral LE weakness, decreased postural control)  Assist level: Supervision/Verbal cueing Assistive device: Walker-rolling    Walk 10 feet on uneven surface  activity   Assist Walk 10 feet on uneven surfaces activity did not occur: Safety/medical concerns(fatigue, bilateral LE weakness, decreased  postural control)   Assist level: Supervision/Verbal cueing Assistive device: Photographer Will patient use wheelchair at discharge?: Yes Type of Wheelchair: Manual    Wheelchair assist level: Supervision/Verbal cueing Max wheelchair  distance: 150    Wheelchair 50 feet with 2 turns activity    Assist        Assist Level: Supervision/Verbal cueing   Wheelchair 150 feet activity     Assist      Assist Level: Supervision/Verbal cueing   Medical Problem List and Plan: 1.  Debility with decreased functional mobility secondary to pulmonary emboli  DC today after therapies  Will see patient for hospital follow in 1 month post-discharge 2.  Antithrombotics: -DVT/anticoagulation:  Cont Eliquis 10mg  BID             -antiplatelet therapy: N/A 3. Pain Management: Tylenol as needed  Controlled on 3/26 4. Mood: provide emotional support             -antipsychotic agents: N/A 5. Neuropsych: This patient is capable of making decisions on his own behalf. 6. Skin/Wound Care: Routine skin checks  Monitor areas of trauma for healing 7. Fluids/Electrolytes/Nutrition: Routine in and outs   BMP within acceptable range on 3/26 8.  Atrial fibrillation.  Continue Eliquis as well as Lopressor 100 mg twice daily.  Follow-up cardiology services Vitals:   04/16/19 0317 04/16/19 0850  BP: (!) 94/52 133/60  Pulse: (!) 51 60  Resp: 16   Temp: 98 F (36.7 C)   SpO2: 97%    Heart rate slightly labile on 3/25 9.  Recent fall 3/7 PTA Right knee focal radial tear at the posterior horn body junction of the medial meniscus.  Weightbearing as tolerated and follow-up orthopedic services Dr. 5/7 10. Nondisplaced left zygomatic arch fracture.  Conservative care 11. Left foot strain from recent fall.  Patient does wear a boot as needed 12. Elevated troponin 2/2 demand ischemia.   Monitor for chest pain 13.  Acute blood loss anemia  Hemoglobin 11.5 on 3/26, monitor as outpatient  Continue to monitor 14.  Mild hypoalbuminemia  Supplement initiated on 3/25  LOS: 7 days A FACE TO FACE EVALUATION WAS PERFORMED  Mame Twombly 4/25 04/16/2019, 8:55 AM

## 2019-04-16 NOTE — Care Management (Signed)
   The overall goal for the admission was met for:   Discharge location: Home with wife and son  Length of Stay: 7 days with discharge 04/16/19  Discharge activity level: [place]  Home/community participation: Clinical research associate provided included: MD, RD, PT, OT, SLP, RN, CM, TR, Pharmacy, Neuropsych and SW  Financial Services: Medicare  Follow-up services arranged: Home Health: PT, OT  Comments (or additional information):[facility] [phone]  Patient/Family verbalized understanding of follow-up arrangements: Yes  Individual responsible for coordination of the follow-up plan: Son Bolingbroke [place]:[phone]   Dorien Chihuahua B

## 2019-04-19 NOTE — Telephone Encounter (Signed)
**Note De-Identified Cathrine Krizan Obfuscation** Patient contacted regarding discharge from [facility] on 04/16/2019.  Patient understands to follow up with provider Tereso Newcomer, PA-c on tomorrow 04/20/2019 at 11:15 at [address] in [place], [place] [address]. Patient understands discharge instructions? Yes Patient understands medications and regiment? Yes Patient understands to bring all medications to this visit? Yes

## 2019-04-19 NOTE — Progress Notes (Signed)
Cardiology Office Note:    Date:  04/20/2019   ID:  Willie Cisneros, DOB Sep 30, 1926, MRN [id number]  PCP:  Gweneth Dimitri, MD  Cardiologist:  Olga Millers, MD   Electrophysiologist:  None   Referring MD: Gweneth Dimitri, MD   Chief Complaint:  Hospitalization Follow-up (AFib/Flutter noted in setting of acute PE)    Patient Profile:    Willie Cisneros is a 86 y.o. male with:   AFib/Flutter  Hx of pulmonary embolism (admx 03/2019)  Recent immobilization with boot on L foot (prob provoked) >> Apixaban   Aortic atherosclerosis  Hx of TIA  Hx of skin CA  Prior CV studies: Echocardiogram 04/03/2019 EF 40-45, mod LVH, mildly reduced RVSF, mild LAE, trivial MR, borderline dilation of asc aorta (39 mm) [Reviewed by Dr. Jens Som - EF felt to be 50-55]   History of Present Illness:    Willie Cisneros was admitted 3/13-3/19 with a pulmonary embolism (recent immobilization with L toe stress fx >> boot on L foot) with associated AFib/Flutter and possible cardiomyopathy.  EF was 40-45 on echocardiogram.  The patient was seen by Dr. Jens Som who personally reviewed the echocardiogram and felt that the EF was probably 50-55.  His atrial arrhythmia was felt to likely be due to pulmonary embolism.  He had an elevated hsTrop also felt to be due to recent pulmonary embolism.  No further cardiac workup was felt to be necessary.  He was placed on rate control and it was noted DCCV may be considered in the future if he does not convert to normal sinus rhythm.  He was DC to inpt rehab.    He returns for follow-up.  He is here with his son.  He has been going through home health physical therapy.  He is feeling better.  He has not had chest pain, significant shortness of breath, orthopnea, syncope.  He has chronic lower extremity swelling without significant change.  He has not had any bleeding issues.  Past Medical History:  Diagnosis Date  . Glaucoma   . Kidney stones   . Medical history  non-contributory   . Shingles   . TIA (transient ischemic attack)     Current Medications: Current Meds  Medication Sig  . apixaban (ELIQUIS) 5 MG TABS tablet Take 1 tablet (5 mg total) by mouth 2 (two) times daily.  Marland Kitchen latanoprost (XALATAN) 0.005 % ophthalmic solution Place 1 drop into both eyes at bedtime.   . metoprolol tartrate (LOPRESSOR) 50 MG tablet Take 1 tablet (50 mg total) by mouth 2 (two) times daily.  . Multiple Vitamins-Minerals (CENTRUM SILVER 50+MEN) TABS Take 1 tablet by mouth daily.  . [DISCONTINUED] metoprolol tartrate (LOPRESSOR) 100 MG tablet Take 1 tablet (100 mg total) by mouth 2 (two) times daily.     Allergies:   Penicillins, Sulfa antibiotics, Clindamycin, Contrast media  [iodinated diagnostic agents], Econazole nitrate, Erythromycin, and Oxycodone   Social History   Tobacco Use  . Smoking status: Never Smoker  . Smokeless tobacco: Never Used  Substance Use Topics  . Alcohol use: Not Currently  . Drug use: Never     Family Hx: The patient's family history is negative for Heart disease.  ROS   EKGs/Labs/Other Test Reviewed:    EKG:  EKG is   ordered today.  The ekg ordered today demonstrates sinus bradycardia, HR 49, left axis deviation, inferior Q waves, QTC 449, nonspecific ST-T wave changes  Recent Labs: 04/03/2019: B Natriuretic Peptide 125.9 04/05/2019: Magnesium 2.2; TSH 1.474  04/12/2019: ALT 30 04/16/2019: BUN 23; Creatinine, Ser 1.11; Hemoglobin 11.5; Platelets 430; Potassium 4.2; Sodium 139   Recent Lipid Panel No results found for: CHOL, TRIG, HDL, CHOLHDL, LDLCALC, LDLDIRECT  Physical Exam:    VS:  BP 100/64   Pulse (!) 49   Ht 5\' 10"  (1.778 m)   Wt 177 lb 12.8 oz (80.6 kg)   SpO2 97%   BMI 25.51 kg/m     Wt Readings from Last 3 Encounters:  04/20/19 177 lb 12.8 oz (80.6 kg)  04/16/19 171 lb 15.3 oz (78 kg)  04/09/19 177 lb 0.5 oz (80.3 kg)     Constitutional:      Appearance: Healthy appearance. Not in distress.   Pulmonary:     Effort: Pulmonary effort is normal.     Breath sounds: No wheezing. No rales.  Cardiovascular:     Bradycardia present. Regular rhythm. Normal S1. Normal S2.     Murmurs: There is no murmur.  Edema:    Pretibial: bilateral 1+ edema of the pretibial area.    Ankle: bilateral 1+ edema of the ankle. Abdominal:     Palpations: Abdomen is soft.     Tenderness: There is no abdominal tenderness.  Musculoskeletal:     Cervical back: Neck supple. Skin:    General: Skin is warm and dry.  Neurological:     General: No focal deficit present.     Mental Status: Alert and oriented to person, place and time.       ASSESSMENT & PLAN:    1. Persistent atrial fibrillation (HCC) 2. Bradycardia CHA2DS2-VASc=4 (age x 2, TIA).  He is back in sinus rhythm.  His heart rate is in the 40s.  His blood pressure is somewhat low.  Fortunately, he is not symptomatic.  I have suggested that we reduce his metoprolol tartrate to 50 mg twice daily.  I will obtain a 3-day ZIO monitor to ensure his heart rate improves and also assess for recurrent atrial fibrillation.  He had an echocardiogram in the hospital that was somewhat concerning for reduced EF.  However, Dr. 04/11/19 reviewed this and it was noted that his EF is actually 50-55%.  He is having some difficulty affording Apixaban.  He filled out paperwork for assistance but does not qualify.  We discussed the possibility of switching to warfarin but he prefers to remain on Apixaban.  Recently, he wanted to follow-up with Dr. Jens Som because his wife had seen him in the past.  However, Dr. Eldridge Dace office is closer to his home and he now prefers to see Dr. Ludwig Clarks.  Follow-up with Dr. Jens Som in 3 months.  Arrange follow-up BMET, CBC in 6 weeks.  3. History of pulmonary embolism His pulmonary embolism seems to be provoked.  Continue Apixaban.  As noted, we briefly discussed switching to Warfarin but he prefers to remain on Apixaban.  Obtain BMET,  CBC in 6 weeks.   Dispo:  Return in about 3 months (around 07/21/2019) for Routine Follow Up w/ Dr. 07/23/2019.   Medication Adjustments/Labs and Tests Ordered: Current medicines are reviewed at length with the patient today.  Concerns regarding medicines are outlined above.  Tests Ordered: Orders Placed This Encounter  Procedures  . CBC  . Basic metabolic panel  . LONG TERM MONITOR (3-14 DAYS)  . EKG 12-Lead   Medication Changes: Meds ordered this encounter  Medications  . metoprolol tartrate (LOPRESSOR) 50 MG tablet    Sig: Take 1 tablet (50 mg total) by mouth  2 (two) times daily.    Dispense:  180 tablet    Refill:  1    Signed, Richardson Dopp, PA-C  04/20/2019 12:07 PM    [address], [place], [place]  [id number] Phone: [phone]; Fax: [phone]

## 2019-04-20 ENCOUNTER — Encounter: Payer: Self-pay | Admitting: Physician Assistant

## 2019-04-20 ENCOUNTER — Other Ambulatory Visit: Payer: Self-pay

## 2019-04-20 ENCOUNTER — Telehealth: Payer: Self-pay | Admitting: Radiology

## 2019-04-20 ENCOUNTER — Ambulatory Visit (INDEPENDENT_AMBULATORY_CARE_PROVIDER_SITE_OTHER): Payer: Medicare Other | Admitting: Physician Assistant

## 2019-04-20 VITALS — BP 100/64 | HR 49 | Ht 70.0 in | Wt 177.8 lb

## 2019-04-20 DIAGNOSIS — R001 Bradycardia, unspecified: Secondary | ICD-10-CM

## 2019-04-20 DIAGNOSIS — Z86711 Personal history of pulmonary embolism: Secondary | ICD-10-CM | POA: Diagnosis not present

## 2019-04-20 DIAGNOSIS — I4819 Other persistent atrial fibrillation: Secondary | ICD-10-CM

## 2019-04-20 MED ORDER — METOPROLOL TARTRATE 50 MG PO TABS: 50.0000 mg | ORAL_TABLET | Freq: Two times a day (BID) | ORAL | 1 refills | Status: DC

## 2019-04-20 NOTE — Telephone Encounter (Signed)
Enrolled patient for a 3 day Zio monitor to be mailed to patients home.  

## 2019-04-20 NOTE — Patient Instructions (Signed)
Medication Instructions:   Your physician has recommended you make the following change in your medication:   1) Decrease Metoprolol to 50 mg, 1 tablet by mouth twice a day  *If you need a refill on your cardiac medications before your next appointment, please call your pharmacy*  Lab Work:  Your physician recommends that you return for lab work in: 6 weeks for CBC and BMET at the [facility] office  If you have labs (blood work) drawn today and your tests are completely normal, you will receive your results only by: Marland Kitchen MyChart Message (if you have MyChart) OR . A paper copy in the mail If you have any lab test that is abnormal or we need to change your treatment, we will call you to review the results.  Testing/Procedures:  A zio monitor was ordered today. It will remain on for 3 days. You will then return monitor and event diary in provided box. It takes 1-2 weeks for report to be downloaded and returned to [place]. We will call you with the results. If monitor falls off or has orange flashing light, please call Zio for further instructions.   Follow-Up: At [facility], you and your health needs are our priority.  As part of our continuing mission to provide you with exceptional heart care, we have created designated Provider Care Teams.  These Care Teams include your primary Cardiologist (physician) and Advanced Practice Providers (APPs -  Physician Assistants and Nurse Practitioners) who all work together to provide you with the care you need, when you need it.  We recommend signing up for the patient portal called "MyChart".  Sign up information is provided on this After Visit Summary.  MyChart is used to connect with patients for Virtual Visits (Telemedicine).  Patients are able to view lab/test results, encounter notes, upcoming appointments, etc.  Non-urgent messages can be sent to your provider as well.   To learn more about what you can do with MyChart, go to [email or web address].     Your next appointment:   3 month(s)  The format for your next appointment:   In Person  Provider:   You may see Olga Millers, MD or one of the following Advanced Practice Providers on your designated Care Team:    Corine Shelter, PA-C  [place], [place]  Edd Fabian, [place]

## 2019-04-26 ENCOUNTER — Other Ambulatory Visit (INDEPENDENT_AMBULATORY_CARE_PROVIDER_SITE_OTHER): Payer: Medicare Other

## 2019-04-26 DIAGNOSIS — I4819 Other persistent atrial fibrillation: Secondary | ICD-10-CM | POA: Diagnosis not present

## 2019-04-26 DIAGNOSIS — R001 Bradycardia, unspecified: Secondary | ICD-10-CM | POA: Diagnosis not present

## 2019-05-12 ENCOUNTER — Encounter: Payer: Self-pay | Admitting: Physician Assistant

## 2019-05-13 ENCOUNTER — Telehealth: Payer: Self-pay | Admitting: *Deleted

## 2019-05-13 ENCOUNTER — Other Ambulatory Visit: Payer: Self-pay | Admitting: *Deleted

## 2019-05-13 ENCOUNTER — Telehealth: Payer: Self-pay

## 2019-05-13 DIAGNOSIS — I4819 Other persistent atrial fibrillation: Secondary | ICD-10-CM

## 2019-05-13 MED ORDER — METOPROLOL TARTRATE 25 MG PO TABS: 25.0000 mg | ORAL_TABLET | Freq: Two times a day (BID) | ORAL | 0 refills | Status: DC

## 2019-05-13 NOTE — Telephone Encounter (Addendum)
The patient came in as a walk in today requesting eliquis samples. He has been advised that we are currently out. He was recently hospitalized and will be seeing Dr. Jens Som as his primary cardiologist.   He stated that he cannot afford the eliquis and was denied assistance. He has been given a 30 day free card to help temporarily.   He also needs a refill on the Metoprolol Tartrate 25 mg bid. He wanted the medication reduced but has been advised that he will need to be seen before that decision could be made and should remain in his current dosage.  He has a follow up with Corine Shelter, PA on 6/29.

## 2019-05-13 NOTE — Telephone Encounter (Signed)
-----   Message from Beatrice Lecher, [place] sent at 05/12/2019  5:18 PM EDT ----- Please call patient Monitor showed avg HR 54. No atrial fibrillation.  He had some slow rhythms at times (junctional beats, idioventricular rhythms).  There were some brief fast heart beats from the top and bottom parts of the heart.  Overall, given some of the slow rhythms, I think we should reduce his beta-blocker further.  PLAN:   - Reduce Metoprolol Tartrate to 25 mg twice daily.  - Keep follow up in June with Corine Shelter, PA-C as planned.  Tereso Newcomer, PA-C    05/12/2019 5:10 PM

## 2019-05-13 NOTE — Telephone Encounter (Signed)
Yes Willie Cisneros  

## 2019-05-13 NOTE — Telephone Encounter (Signed)
The patient was called to advise on the Metoprolol refill but has already been called about a monitor result. The new script ws already sent in.  ----- Message from Beatrice Lecher, PA-C sent at 05/12/2019  5:18 PM EDT ----- Please call patient Monitor showed avg HR 54. No atrial fibrillation.  He had some slow rhythms at times (junctional beats, idioventricular rhythms).  There were some brief fast heart beats from the top and bottom parts of the heart.  Overall, given some of the slow rhythms, I think we should reduce his beta-blocker further.  PLAN:   - Reduce Metoprolol Tartrate to 25 mg twice daily.  - Keep follow up in June with Corine Shelter, PA-C as planned.  Tereso Newcomer, PA-C    05/12/2019 5:10 PM

## 2019-05-13 NOTE — Telephone Encounter (Signed)
The patient has been notified of the result and verbalized understanding.  All questions (if any) were answered. New prescription called in for Metoprolol 25 mg, 1 tablet by mouth twice a day. Patient aware to keep follow up with Corine Shelter on 07/20/19. Lajoyce Corners, CMA 05/13/2019 3:08 PM

## 2019-05-14 LAB — CBC
Hematocrit: 38.4 % (ref 37.5–51.0)
Hemoglobin: 12.7 g/dL — ABNORMAL LOW (ref 13.0–17.7)
MCH: 29.9 pg (ref 26.6–33.0)
MCHC: 33.1 g/dL (ref 31.5–35.7)
MCV: 90 fL (ref 79–97)
Platelets: 169 10*3/uL (ref 150–450)
RBC: 4.25 x10E6/uL (ref 4.14–5.80)
RDW: 14.2 % (ref 11.6–15.4)
WBC: 5.6 10*3/uL (ref 3.4–10.8)

## 2019-05-14 LAB — BASIC METABOLIC PANEL
BUN/Creatinine Ratio: 25 — ABNORMAL HIGH (ref 10–24)
BUN: 27 mg/dL (ref 10–36)
CO2: 22 mmol/L (ref 20–29)
Calcium: 9.5 mg/dL (ref 8.6–10.2)
Chloride: 105 mmol/L (ref 96–106)
Creatinine, Ser: 1.08 mg/dL (ref 0.76–1.27)
GFR calc Af Amer: 69 mL/min/{1.73_m2} (ref >59)
GFR calc non Af Amer: 60 mL/min/{1.73_m2} (ref >59)
Glucose: 101 mg/dL — ABNORMAL HIGH (ref 65–99)
Potassium: 4.3 mmol/L (ref 3.5–5.2)
Sodium: 144 mmol/L (ref 134–144)

## 2019-05-20 ENCOUNTER — Other Ambulatory Visit: Payer: Self-pay

## 2019-05-20 ENCOUNTER — Encounter: Payer: Medicare Other | Attending: Physical Medicine & Rehabilitation | Admitting: Physical Medicine & Rehabilitation

## 2019-05-20 ENCOUNTER — Encounter: Payer: Self-pay | Admitting: Physical Medicine & Rehabilitation

## 2019-05-20 VITALS — BP 163/76 | HR 60 | Temp 97.5°F | Ht 70.0 in | Wt 184.0 lb

## 2019-05-20 DIAGNOSIS — S83241S Other tear of medial meniscus, current injury, right knee, sequela: Secondary | ICD-10-CM | POA: Diagnosis present

## 2019-05-20 DIAGNOSIS — L989 Disorder of the skin and subcutaneous tissue, unspecified: Secondary | ICD-10-CM | POA: Diagnosis present

## 2019-05-20 DIAGNOSIS — I4891 Unspecified atrial fibrillation: Secondary | ICD-10-CM | POA: Diagnosis present

## 2019-05-20 DIAGNOSIS — R5381 Other malaise: Secondary | ICD-10-CM

## 2019-05-20 DIAGNOSIS — S83241A Other tear of medial meniscus, current injury, right knee, initial encounter: Secondary | ICD-10-CM | POA: Insufficient documentation

## 2019-05-20 MED ORDER — SANTYL 250 UNIT/GM EX OINT: 1.0000 "application " | TOPICAL_OINTMENT | Freq: Every day | CUTANEOUS | 1 refills | Status: AC

## 2019-05-20 NOTE — Progress Notes (Addendum)
Subjective:    Patient ID: Willie Cisneros, male    DOB: [birth year], 86 y.o.   MRN: [id number]  HPI Right-handed male with history of TIA as well as shingles presents for hospital follow-up after receiving CIR for debility.  At discharge, he was instructed to follow-up with Ortho, but states he did not follow up because "it works perfect". He has followed up with Cards. Denies pain. He is working out at his club. Denies falls.  He is very appreciative of the care that he received while he was on CIR and feels younger than his age.  He also states that because his wife needed to take care of things at home that he was previously taking care of, her cognition (dementia) has also improved.  Therapies: Released from [facility]  Pain Inventory Average Pain 0 Pain Right Now 0 My pain is no pain  In the last 24 hours, has pain interfered with the following? General activity 0 Relation with others 0 Enjoyment of life 0 What TIME of day is your pain at its worst? no pain Sleep (in general) Good  Pain is worse with: no pain Pain improves with: no pain Relief from Meds: no pain meds  Mobility walk without assistance ability to climb steps?  yes do you drive?  yes  Function retired  Neuro/Psych No problems in this area  Prior Studies hospital f/u  Physicians involved in your care hospital f/u   Family History  Problem Relation Age of Onset  . Heart disease Neg Hx    Social History   Socioeconomic History  . Marital status: Married    Spouse name: Mohamed Portlock  . Number of children: Not on file  . Years of education: Not on file  . Highest education level: Not on file  Occupational History  . Not on file  Tobacco Use  . Smoking status: Never Smoker  . Smokeless tobacco: Never Used  Substance and Sexual Activity  . Alcohol use: Not Currently  . Drug use: Never  . Sexual activity: Yes  Other Topics Concern  . Not on file  Social History Narrative  . Not on file    Social Determinants of Health   Financial Resource Strain:   . Difficulty of Paying Living Expenses:   Food Insecurity:   . Worried About Charity fundraiser in the Last Year:   . Arboriculturist in the Last Year:   Transportation Needs:   . Film/video editor (Medical):   Marland Kitchen Lack of Transportation (Non-Medical):   Physical Activity:   . Days of Exercise per Week:   . Minutes of Exercise per Session:   Stress:   . Feeling of Stress :   Social Connections:   . Frequency of Communication with Friends and Family:   . Frequency of Social Gatherings with Friends and Family:   . Attends Religious Services:   . Active Member of Clubs or Organizations:   . Attends Archivist Meetings:   Marland Kitchen Marital Status:    Past Surgical History:  Procedure Laterality Date  . NO PAST SURGERIES     Past Medical History:  Diagnosis Date  . Atrial fibrillation ([place])    LT 3-14 day monitor 04/2019: NSR, sinus brady (avg HR 54; lowest 40); occ PAC, brief PAT, PVCs, 6 beats NSVT, occ junctional beat and idioventricular rhythm.  . Glaucoma   . Kidney stones   . Medical history non-contributory   . Shingles   .  TIA (transient ischemic attack)    BP (!) 163/76   Pulse 60   Temp (!) 97.5 F (36.4 C)   Ht 5\' 10"  (1.778 m)   Wt 184 lb (83.5 kg)   SpO2 96%   BMI 26.40 kg/m   Opioid Risk Score:   Fall Risk Score:  `1  Depression screen PHQ 2/9  No flowsheet data found.  Review of Systems  Constitutional: Negative.   HENT: Negative.   Eyes: Negative.   Respiratory: Negative.   Cardiovascular: Negative.   Gastrointestinal: Negative.   Endocrine: Negative.   Genitourinary: Negative.   Musculoskeletal: Negative.   Skin: Negative.   Allergic/Immunologic: Negative.   Hematological: Negative.       Objective:   Physical Exam Constitutional: NAD. Vital signs reviewed. HENT: Normocephalic.  Atraumatic.  Skin: Right knee eschar over patella. Psych: Normal mood.  Normal  behavior. Musc: No edema or tenderness in extremities Neurologic: Alert Motor: 5/5 throughout, stable    Assessment & Plan:  Right-handed male with history of TIA as well as shingles presents for hospital follow-up after receiving CIR for debility.  1. Debility with decreased functional mobility secondary to pulmonary emboli  Completed therapies, does not need outpatient therapies, working out at 4/9 HEP/excercises at club  2.  Atrial fibrillation.    Continue Eliquis, reviewed pharmacies with patient with more affordable options as patient does not have prescription coverage  Cont Lopressor twice daily.    Cont follow up with Cards  3.  Fall 3/7 PTA Right knee focal radial tear at the posterior horn body junction of the medial meniscus.               Weightbearing as tolerated   States he does not need to follow up with Ortho because "it is perfect"  Follow up if symptoms get worse  4. Right knee with eschar  Improving eschar per patient  Patient does not want debridement at present  Will order Santyl - educated patient and wife on application

## 2019-06-16 MED ORDER — APIXABAN 5 MG PO TABS: 5.0000 mg | ORAL_TABLET | Freq: Two times a day (BID) | ORAL | 1 refills | Status: DC

## 2019-06-16 MED ORDER — APIXABAN 5 MG PO TABS: 5.0000 mg | ORAL_TABLET | Freq: Two times a day (BID) | ORAL | 3 refills | Status: DC

## 2019-06-16 NOTE — Telephone Encounter (Signed)
Spoke with pt, 1 box of eliquis placed at the front desk for patient pick up, also [facility]. He will discuss at follow up appointment.

## 2019-06-16 NOTE — Addendum Note (Signed)
Addended by: Freddi Starr on: 06/16/2019 01:52 PM   Modules accepted: Orders

## 2019-06-16 NOTE — Telephone Encounter (Signed)
Pt walked in this morning sating that he needs a refill of his Eliquis(pt only has 2 pills left). Per previous message pt has tried for Pt assistance and has been denied. No samples available. Pt was given 30 day free card last month(so he cannot use another). What would you like to do? Please advise.

## 2019-07-20 ENCOUNTER — Ambulatory Visit (INDEPENDENT_AMBULATORY_CARE_PROVIDER_SITE_OTHER): Payer: Medicare Other | Admitting: Cardiology

## 2019-07-20 ENCOUNTER — Other Ambulatory Visit: Payer: Self-pay

## 2019-07-20 ENCOUNTER — Encounter: Payer: Self-pay | Admitting: Cardiology

## 2019-07-20 DIAGNOSIS — R001 Bradycardia, unspecified: Secondary | ICD-10-CM

## 2019-07-20 DIAGNOSIS — I2699 Other pulmonary embolism without acute cor pulmonale: Secondary | ICD-10-CM | POA: Diagnosis not present

## 2019-07-20 DIAGNOSIS — I480 Paroxysmal atrial fibrillation: Secondary | ICD-10-CM | POA: Diagnosis not present

## 2019-07-20 MED ORDER — METOPROLOL TARTRATE 25 MG PO TABS: 12.5000 mg | ORAL_TABLET | Freq: Two times a day (BID) | ORAL | 1 refills | Status: DC

## 2019-07-20 NOTE — Patient Instructions (Addendum)
Medication Instructions:   DECREASE Metoprolol to 12.5 mg (1/2 tab) twice daily.  *If you need a refill on your cardiac medications before your next appointment, please call your pharmacy*   Follow-Up: At [facility], you and your health needs are our priority.  As part of our continuing mission to provide you with exceptional heart care, we have created designated Provider Care Teams.  These Care Teams include your primary Cardiologist (physician) and Advanced Practice Providers (APPs -  Physician Assistants and Nurse Practitioners) who all work together to provide you with the care you need, when you need it.  We recommend signing up for the patient portal called "MyChart".  Sign up information is provided on this After Visit Summary.  MyChart is used to connect with patients for Virtual Visits (Telemedicine).  Patients are able to view lab/test results, encounter notes, upcoming appointments, etc.  Non-urgent messages can be sent to your provider as well.   To learn more about what you can do with MyChart, go to [email or web address].    Your next appointment:   September 27th at 8:20 AM  The format for your next appointment:   In Person  Provider:   Olga Millers, MD

## 2019-07-20 NOTE — Assessment & Plan Note (Signed)
I believe he is symptomatic decrease Metoprolol to 12.5 mg BID.

## 2019-07-20 NOTE — Assessment & Plan Note (Signed)
Secondary to PE March 2021

## 2019-07-20 NOTE — Progress Notes (Signed)
Cardiology Office Note:    Date:  07/20/2019   ID:  Willie Cisneros, DOB [birth year], MRN [id number]  PCP:  Willie Dimitri, MD  Cardiologist:  Willie Millers, MD  Electrophysiologist:  None   Referring MD: Willie Dimitri, MD   No chief complaint on file.   History of Present Illness:    Willie Cisneros is a 86 y.o. male with a hx of fall Feb 2021. He suffered a rt leg injury and was somewhat immobilized.  He developed increasing SOB and was admitted  With a PE.  His course was complicated by PAF.  Echocardiogram during that admission showed an ejection fraction of 40 to 45% but this was over read by Dr. Jens Cisneros who felt it was closer to 50 to 55%.  The patient was discharged on Eliquis and metoprolol.  When he was seen in follow-up later in March he was in sinus rhythm.  He is seen today for routine 73-month follow-up.  Since we saw him last he has been doing well.  He says he goes to the gym every day and walks a mile to a mile and a half.  He does notice fatigue but he seems to work through this.  He has not had any unusual shortness of breath orthopnea although he admits to some generalized fatigue especially after he exercises.  His heart rate in the office today is 47 and sinus rhythm.  Past Medical History:  Diagnosis Date  . Atrial fibrillation (HCC)    LT 3-14 day monitor 04/2019: NSR, sinus brady (avg HR 54; lowest 40); occ PAC, brief PAT, PVCs, 6 beats NSVT, occ junctional beat and idioventricular rhythm.  . Glaucoma   . Kidney stones   . Medical history non-contributory   . Shingles   . TIA (transient ischemic attack)     Past Surgical History:  Procedure Laterality Date  . NO PAST SURGERIES      Current Medications: Current Meds  Medication Sig  . apixaban (ELIQUIS) 5 MG TABS tablet Take 1 tablet (5 mg total) by mouth 2 (two) times daily.  . collagenase (SANTYL) ointment Apply 1 application topically daily.  Marland Kitchen latanoprost (XALATAN) 0.005 % ophthalmic solution  Place 1 drop into both eyes at bedtime.   . metoprolol tartrate (LOPRESSOR) 25 MG tablet Take 0.5 tablets (12.5 mg total) by mouth 2 (two) times daily.  . Multiple Vitamins-Minerals (CENTRUM SILVER 50+MEN) TABS Take 1 tablet by mouth daily.  . [DISCONTINUED] metoprolol tartrate (LOPRESSOR) 25 MG tablet Take 1 tablet (25 mg total) by mouth 2 (two) times daily.     Allergies:   Penicillins, Sulfa antibiotics, Clindamycin, Contrast media  [iodinated diagnostic agents], Econazole nitrate, Erythromycin, and Oxycodone   Social History   Socioeconomic History  . Marital status: Married    Spouse name: Willie Cisneros  . Number of children: Not on file  . Years of education: Not on file  . Highest education level: Not on file  Occupational History  . Not on file  Tobacco Use  . Smoking status: Never Smoker  . Smokeless tobacco: Never Used  Substance and Sexual Activity  . Alcohol use: Not Currently  . Drug use: Never  . Sexual activity: Yes  Other Topics Concern  . Not on file  Social History Narrative  . Not on file   Social Determinants of Health   Financial Resource Strain:   . Difficulty of Paying Living Expenses:   Food Insecurity:   . Worried About Radiation protection practitioner  of Food in the Last Year:   . Ran Out of Food in the Last Year:   Transportation Needs:   . Lack of Transportation (Medical):   Marland Kitchen Lack of Transportation (Non-Medical):   Physical Activity:   . Days of Exercise per Week:   . Minutes of Exercise per Session:   Stress:   . Feeling of Stress :   Social Connections:   . Frequency of Communication with Friends and Family:   . Frequency of Social Gatherings with Friends and Family:   . Attends Religious Services:   . Active Member of Clubs or Organizations:   . Attends Banker Meetings:   Marland Kitchen Marital Status:      Family History: The patient's family history is negative for Heart disease.  ROS:   Please see the history of present illness.     All  other systems reviewed and are negative.  EKGs/Labs/Other Studies Reviewed:    The following studies were reviewed today: Echo March 2021  EKG:  EKG is ordered today.  The ekg ordered today demonstrates NSR, SB 47  Recent Labs: 04/03/2019: B Natriuretic Peptide 125.9 04/05/2019: Magnesium 2.2; TSH 1.474 04/12/2019: ALT 30 05/13/2019: BUN 27; Creatinine, Ser 1.08; Hemoglobin 12.7; Platelets 169; Potassium 4.3; Sodium 144  Recent Lipid Panel No results found for: CHOL, TRIG, HDL, CHOLHDL, VLDL, LDLCALC, LDLDIRECT  Physical Exam:    VS:  BP (!) 148/62   Pulse 62   Wt 188 lb (85.3 kg)   SpO2 97%   BMI 26.98 kg/m     Wt Readings from Last 3 Encounters:  07/20/19 188 lb (85.3 kg)  05/20/19 184 lb (83.5 kg)  04/20/19 177 lb 12.8 oz (80.6 kg)     GEN: Well nourished, well developed in no acute distress HEENT: Normal NECK: No JVD; No carotid bruits CARDIAC: RRR, no murmurs, rubs, gallops RESPIRATORY:  Bilateral velcro crackles ABDOMEN: Soft, non-tender, non-distended MUSCULOSKELETAL:  No edema; No deformity  SKIN: Warm and dry NEUROLOGIC:  Alert and oriented x 3 PSYCHIATRIC:  Normal affect   ASSESSMENT:    Pulmonary embolism [facility]) March 2021 after a leg injury  PAF (paroxysmal atrial fibrillation) (HCC) Secondary to PE March 2021  Bradycardia I believe he is symptomatic decrease Metoprolol to 12.5 mg BID.   Anticoagulated- Eliquis is costing him 1k a month.  He does not qualify for assistance.  He will consider changing to warfarin when his current Rx is close to running out.    PLAN:    I suggested Willie Cisneros decrease his metoprolol to 12.5 mg twice daily.  He will consider switching to warfarin when his current Rx for Eliquis is close to running out.  On exam he did have bilateral Velcro crackles that sounded like interstitial lung disease.  This was not noted on his CT scan or his chest x-ray though.  He did not appear to be in congestive heart failure. For now no  change in Rx but if he develops any dyspnea I would Rx with a short course of Lasix.   Keep f/u with Dr Willie Cisneros as scheduled.    Medication Adjustments/Labs and Tests Ordered: Current medicines are reviewed at length with the patient today.  Concerns regarding medicines are outlined above.  Orders Placed This Encounter  Procedures  . EKG 12-Lead   Meds ordered this encounter  Medications  . metoprolol tartrate (LOPRESSOR) 25 MG tablet    Sig: Take 0.5 tablets (12.5 mg total) by mouth 2 (two) times  daily.    Dispense:  135 tablet    Refill:  1    Patient Instructions  Medication Instructions:   DECREASE Metoprolol to 12.5 mg (1/2 tab) twice daily.  *If you need a refill on your cardiac medications before your next appointment, please call your pharmacy*   Follow-Up: At [facility], you and your health needs are our priority.  As part of our continuing mission to provide you with exceptional heart care, we have created designated Provider Care Teams.  These Care Teams include your primary Cardiologist (physician) and Advanced Practice Providers (APPs -  Physician Assistants and Nurse Practitioners) who all work together to provide you with the care you need, when you need it.  We recommend signing up for the patient portal called "MyChart".  Sign up information is provided on this After Visit Summary.  MyChart is used to connect with patients for Virtual Visits (Telemedicine).  Patients are able to view lab/test results, encounter notes, upcoming appointments, etc.  Non-urgent messages can be sent to your provider as well.   To learn more about what you can do with MyChart, go to [email or web address].    Your next appointment:   September 27th at 8:20 AM  The format for your next appointment:   In Person  Provider:   Olga Millers, MD     Signed, Corine Shelter, PA-C  07/20/2019 9:27 AM    [facility]

## 2019-07-20 NOTE — Assessment & Plan Note (Signed)
March 2021 after a leg injury

## 2019-07-27 ENCOUNTER — Other Ambulatory Visit: Payer: Self-pay | Admitting: Cardiology

## 2019-07-27 MED ORDER — APIXABAN 5 MG PO TABS: 5.0000 mg | ORAL_TABLET | Freq: Two times a day (BID) | ORAL | 1 refills | Status: DC

## 2019-07-27 NOTE — Telephone Encounter (Signed)
New message   *STAT* If patient is at the pharmacy, call can be transferred to refill team.   1. Which medications need to be refilled? (please list name of each medication and dose if known) apixaban (ELIQUIS) 5 MG TABS tablet  2. Which pharmacy/location (including street and city if local pharmacy) is medication to be sent to? [id number] [place], [place] [facility] [address]  3. Do they need a 30 day or 90 day supply? 90 day

## 2019-07-28 ENCOUNTER — Telehealth: Payer: Self-pay | Admitting: Internal Medicine

## 2019-07-28 MED ORDER — APIXABAN 5 MG PO TABS: 5.0000 mg | ORAL_TABLET | Freq: Two times a day (BID) | ORAL | 3 refills | Status: DC

## 2019-07-28 NOTE — Telephone Encounter (Signed)
Willie Cisneros, Can you check with patient?

## 2019-07-28 NOTE — Telephone Encounter (Signed)
Spoke with pt, refill for eliquis sent to the pharmacy.

## 2019-07-28 NOTE — Addendum Note (Signed)
Addended by: Freddi Starr on: 07/28/2019 02:36 PM   Modules accepted: Orders

## 2019-07-28 NOTE — Telephone Encounter (Signed)
Cardiology Moon[place] Note  Received voicemail message from patient. Patient with history of AF and PE per chart review. Has seen Dr. Jens Som. On my review of the chart, I cannot find any documentation that I have ever seen this patient. His voicemail states he has appointment with cardiology in September but he needs help with a few issues in the interim. Will cc Dr. Jens Som on this note.   Rosario Jacks, MD Cardiology Fellow, PGY-8

## 2019-10-04 NOTE — Progress Notes (Signed)
HPI: Follow-up prior pulmonary embolus and paroxysmal atrial fibrillation.  Patient suffered a leg injury February 2021.  He then had a pulmonary embolus (CTA showed pulmonary embolus and coronary artery calcification) and also had atrial fibrillation.  Echocardiogram March 2021 interpreted as ejection fraction 40 to 45% but I did review personally and felt it was closer to 50 to 55%.  There was moderate left ventricular hypertrophy, mild left atrial enlargement and mildly dilated ascending aorta.  Monitor April 2021 showed sinus bradycardia, normal sinus rhythm, occasional PAC, brief PAT, PVCs and 6 beats nonsustained ventricular tachycardia; occasional junctional beat and idioventricular rhythm.  Since last seen patient denies chest pain, palpitations or syncope.  He has some dyspnea on exertion.  No orthopnea, PND or pedal edema.  Current Outpatient Medications  Medication Sig Dispense Refill  . apixaban (ELIQUIS) 5 MG TABS tablet Take 1 tablet (5 mg total) by mouth 2 (two) times daily. 180 tablet 3  . collagenase (SANTYL) ointment Apply 1 application topically daily. 30 g 1  . latanoprost (XALATAN) 0.005 % ophthalmic solution Place 1 drop into both eyes at bedtime.     . metoprolol tartrate (LOPRESSOR) 25 MG tablet Take 0.5 tablets (12.5 mg total) by mouth 2 (two) times daily. 135 tablet 1  . Multiple Vitamins-Minerals (CENTRUM SILVER 50+MEN) TABS Take 1 tablet by mouth daily.     No current facility-administered medications for this visit.     Past Medical History:  Diagnosis Date  . Atrial fibrillation (HCC)    LT 3-14 day monitor 04/2019: NSR, sinus brady (avg HR 54; lowest 40); occ PAC, brief PAT, PVCs, 6 beats NSVT, occ junctional beat and idioventricular rhythm.  . Glaucoma   . Kidney stones   . Medical history non-contributory   . Shingles   . TIA (transient ischemic attack)     Past Surgical History:  Procedure Laterality Date  . NO PAST SURGERIES      Social  History   Socioeconomic History  . Marital status: Married    Spouse name: Shon Indelicato  . Number of children: Not on file  . Years of education: Not on file  . Highest education level: Not on file  Occupational History  . Not on file  Tobacco Use  . Smoking status: Never Smoker  . Smokeless tobacco: Never Used  Substance and Sexual Activity  . Alcohol use: Not Currently  . Drug use: Never  . Sexual activity: Yes  Other Topics Concern  . Not on file  Social History Narrative  . Not on file   Social Determinants of Health   Financial Resource Strain:   . Difficulty of Paying Living Expenses: Not on file  Food Insecurity:   . Worried About Programme researcher, broadcasting/film/video in the Last Year: Not on file  . Ran Out of Food in the Last Year: Not on file  Transportation Needs:   . Lack of Transportation (Medical): Not on file  . Lack of Transportation (Non-Medical): Not on file  Physical Activity:   . Days of Exercise per Week: Not on file  . Minutes of Exercise per Session: Not on file  Stress:   . Feeling of Stress : Not on file  Social Connections:   . Frequency of Communication with Friends and Family: Not on file  . Frequency of Social Gatherings with Friends and Family: Not on file  . Attends Religious Services: Not on file  . Active Member of Clubs or Organizations: Not  on file  . Attends Banker Meetings: Not on file  . Marital Status: Not on file  Intimate Partner Violence:   . Fear of Current or Ex-Partner: Not on file  . Emotionally Abused: Not on file  . Physically Abused: Not on file  . Sexually Abused: Not on file    Family History  Problem Relation Age of Onset  . Heart disease Neg Hx     ROS: no fevers or chills, productive cough, hemoptysis, dysphasia, odynophagia, melena, hematochezia, dysuria, hematuria, rash, seizure activity, orthopnea, PND, pedal edema, claudication. Remaining systems are negative.  Physical Exam: Well-developed  well-nourished in no acute distress.  Skin is warm and dry.  HEENT is normal.  Neck is supple.  Chest is clear to auscultation with normal expansion.  Cardiovascular exam is regular rate and rhythm.  Abdominal exam nontender or distended. No masses palpated. Extremities show no edema. neuro grossly intact  ECG-Marked sinus bradycardia with PVCs and junctional escape beat, heart rate 43, nonspecific ST changes.  Personally reviewed  A/P  1 pulmonary embolus-occurred after a leg injury March 2021.  He has completed course of apixaban and we will discontinue.  2 paroxysmal atrial fibrillation-this was felt to be secondary to the stress of his pulmonary embolus.  Follow-up monitor showed no atrial fibrillation.  3 previous bradycardia-patient remains bradycardic this morning.  Discontinue metoprolol and follow.  He has no history of presyncope or syncope.  4 coronary calcification-noted on CT scan.  Add aspirin 81 mg daily.  Add Crestor 20 mg daily.  Check lipids and liver in 12 weeks.  Olga Millers, MD

## 2019-10-18 ENCOUNTER — Other Ambulatory Visit: Payer: Self-pay

## 2019-10-18 ENCOUNTER — Ambulatory Visit (INDEPENDENT_AMBULATORY_CARE_PROVIDER_SITE_OTHER): Payer: Medicare Other | Admitting: Cardiology

## 2019-10-18 ENCOUNTER — Encounter: Payer: Self-pay | Admitting: Cardiology

## 2019-10-18 VITALS — BP 110/60 | HR 43 | Ht 70.0 in | Wt 180.6 lb

## 2019-10-18 DIAGNOSIS — E7800 Pure hypercholesterolemia, unspecified: Secondary | ICD-10-CM

## 2019-10-18 DIAGNOSIS — I480 Paroxysmal atrial fibrillation: Secondary | ICD-10-CM | POA: Diagnosis not present

## 2019-10-18 DIAGNOSIS — R001 Bradycardia, unspecified: Secondary | ICD-10-CM

## 2019-10-18 DIAGNOSIS — I2699 Other pulmonary embolism without acute cor pulmonale: Secondary | ICD-10-CM | POA: Diagnosis not present

## 2019-10-18 MED ORDER — ASPIRIN EC 81 MG PO TBEC: 81.0000 mg | DELAYED_RELEASE_TABLET | Freq: Every day | ORAL | 3 refills | Status: AC

## 2019-10-18 MED ORDER — ROSUVASTATIN CALCIUM 20 MG PO TABS
20.0000 mg | ORAL_TABLET | Freq: Every day | ORAL | 3 refills | Status: DC
Start: 2019-10-18 — End: 2020-10-05

## 2019-10-18 NOTE — Patient Instructions (Signed)
Medication Instructions:   STOP ELIQUIS  STOP METOPROLOL  START ROUSVASTATIN 20 MG ONCE DAILY  START ASPIRIN 81 MG ONCE DAILY  *If you need a refill on your cardiac medications before your next appointment, please call your pharmacy*   Lab Work:  Your physician recommends that you return for lab work 12 WEEKS FASTING  If you have labs (blood work) drawn today and your tests are completely normal, you will receive your results only by:  MyChart Message (if you have MyChart) OR  A paper copy in the mail If you have any lab test that is abnormal or we need to change your treatment, we will call you to review the results.   Follow-Up: At [facility], you and your health needs are our priority.  As part of our continuing mission to provide you with exceptional heart care, we have created designated Provider Care Teams.  These Care Teams include your primary Cardiologist (physician) and Advanced Practice Providers (APPs -  Physician Assistants and Nurse Practitioners) who all work together to provide you with the care you need, when you need it.  We recommend signing up for the patient portal called "MyChart".  Sign up information is provided on this After Visit Summary.  MyChart is used to connect with patients for Virtual Visits (Telemedicine).  Patients are able to view lab/test results, encounter notes, upcoming appointments, etc.  Non-urgent messages can be sent to your provider as well.   To learn more about what you can do with MyChart, go to [email or web address].    Your next appointment:   6 month(s)  The format for your next appointment:   In Person  Provider:   You may see Olga Millers, MD or one of the following Advanced Practice Providers on your designated Care Team:    Corine Shelter, PA-C  [place], [place]  Edd Fabian, [place]

## 2020-01-12 LAB — LIPID PANEL
Chol/HDL Ratio: 2.3 ratio (ref 0.0–5.0)
Cholesterol, Total: 135 mg/dL (ref 100–199)
HDL: 60 mg/dL (ref >39)
LDL Chol Calc (NIH): 57 mg/dL (ref 0–99)
Triglycerides: 99 mg/dL (ref 0–149)
VLDL Cholesterol Cal: 18 mg/dL (ref 5–40)

## 2020-01-12 LAB — HEPATIC FUNCTION PANEL
ALT: 24 IU/L (ref 0–44)
AST: 28 IU/L (ref 0–40)
Albumin: 4.5 g/dL (ref 3.5–4.6)
Alkaline Phosphatase: 55 IU/L (ref 44–121)
Bilirubin Total: 0.8 mg/dL (ref 0.0–1.2)
Bilirubin, Direct: 0.21 mg/dL (ref 0.00–0.40)
Total Protein: 7.1 g/dL (ref 6.0–8.5)

## 2020-01-19 ENCOUNTER — Encounter: Payer: Self-pay | Admitting: *Deleted

## 2020-03-27 NOTE — Progress Notes (Signed)
HPI: Follow-up prior pulmonary embolus and paroxysmal atrial fibrillation. Patient suffered a leg injury February 2021.  He then had a pulmonary embolus (CTA showed pulmonary embolus and coronary artery calcification) and also had atrial fibrillation. Echocardiogram March 2021 interpreted as ejection fraction 40 to 45% but I did review personally and felt it was closer to 50 to 55%.  There was moderate left ventricular hypertrophy, mild left atrial enlargement and mildly dilated ascending aorta.  Monitor April 2021 showed sinus bradycardia, normal sinus rhythm, occasional PAC, brief PAT, PVCs and 6 beats nonsustained ventricular tachycardia; occasional junctional beat and idioventricular rhythm.  Since last seen  he does have some dyspnea on exertion.  He denies orthopnea, PND, chest pain or syncope.  Minimal pedal edema.   Current Outpatient Medications  Medication Sig Dispense Refill   aspirin EC 81 MG tablet Take 1 tablet (81 mg total) by mouth daily. Swallow whole. 90 tablet 3   collagenase (SANTYL) ointment Apply 1 application topically daily. 30 g 1   latanoprost (XALATAN) 0.005 % ophthalmic solution Place 1 drop into both eyes at bedtime.      Multiple Vitamins-Minerals (CENTRUM SILVER 50+MEN) TABS Take 1 tablet by mouth daily.     rosuvastatin (CRESTOR) 20 MG tablet Take 1 tablet (20 mg total) by mouth daily. 90 tablet 3   No current facility-administered medications for this visit.     Past Medical History:  Diagnosis Date   Atrial fibrillation (HCC)    LT 3-14 day monitor 04/2019: NSR, sinus brady (avg HR 54; lowest 40); occ PAC, brief PAT, PVCs, 6 beats NSVT, occ junctional beat and idioventricular rhythm.   Glaucoma    Kidney stones    Medical history non-contributory    Shingles    TIA (transient ischemic attack)     Past Surgical History:  Procedure Laterality Date   NO PAST SURGERIES      Social History   Socioeconomic History   Marital status:  Married    Spouse name: Donovon Micheletti   Number of children: Not on file   Years of education: Not on file   Highest education level: Not on file  Occupational History   Not on file  Tobacco Use   Smoking status: Never Smoker   Smokeless tobacco: Never Used  Substance and Sexual Activity   Alcohol use: Not Currently   Drug use: Never   Sexual activity: Yes  Other Topics Concern   Not on file  Social History Narrative   Not on file   Social Determinants of Health   Financial Resource Strain: Not on file  Food Insecurity: Not on file  Transportation Needs: Not on file  Physical Activity: Not on file  Stress: Not on file  Social Connections: Not on file  Intimate Partner Violence: Not on file    Family History  Problem Relation Age of Onset   Heart disease Neg Hx     ROS: no fevers or chills, productive cough, hemoptysis, dysphasia, odynophagia, melena, hematochezia, dysuria, hematuria, rash, seizure activity, orthopnea, PND, claudication. Remaining systems are negative.  Physical Exam: Well-developed well-nourished in no acute distress.  Skin is warm and dry.  HEENT is normal.  Neck is supple.  Chest is clear to auscultation with normal expansion.  Cardiovascular exam is regular rate and rhythm.  Abdominal exam nontender or distended. No masses palpated. Extremities show trace edema. neuro grossly intact  A/P  1 paroxysmal atrial fibrillation-this occurred in the setting of pulmonary embolus.  Follow-up monitor showed no atrial fibrillation.  Apixaban has been discontinued as his pulmonary embolus was treated.  2 bradycardia-metoprolol has been discontinued.  No history of syncope.  3 coronary calcification-continue aspirin and statin.  4 history of borderline LV dysfunction-he notes some DOE; plan repeat echocardiogram to reassess.  Olga Millers, MD

## 2020-04-10 ENCOUNTER — Encounter: Payer: Self-pay | Admitting: Cardiology

## 2020-04-10 ENCOUNTER — Other Ambulatory Visit: Payer: Self-pay

## 2020-04-10 ENCOUNTER — Ambulatory Visit (INDEPENDENT_AMBULATORY_CARE_PROVIDER_SITE_OTHER): Payer: Medicare Other | Admitting: Cardiology

## 2020-04-10 VITALS — BP 140/76 | HR 54 | Ht 70.0 in | Wt 186.4 lb

## 2020-04-10 DIAGNOSIS — R001 Bradycardia, unspecified: Secondary | ICD-10-CM

## 2020-04-10 DIAGNOSIS — I480 Paroxysmal atrial fibrillation: Secondary | ICD-10-CM | POA: Diagnosis not present

## 2020-04-10 DIAGNOSIS — Z86711 Personal history of pulmonary embolism: Secondary | ICD-10-CM | POA: Diagnosis not present

## 2020-04-10 DIAGNOSIS — I2510 Atherosclerotic heart disease of native coronary artery without angina pectoris: Secondary | ICD-10-CM

## 2020-04-10 DIAGNOSIS — R0600 Dyspnea, unspecified: Secondary | ICD-10-CM

## 2020-04-10 NOTE — Patient Instructions (Signed)

## 2020-04-27 ENCOUNTER — Telehealth (HOSPITAL_COMMUNITY): Payer: Self-pay | Admitting: Cardiology

## 2020-04-27 NOTE — Telephone Encounter (Signed)
Patient called and cancelled echocardiogram due to he is leaving the [place] for awhile and going to [place] and does not know when he will be back. He will call us back at a later date when he is ready to reschedule. Order will be removed from the Wq and when he calls back we can reinstate the order.

## 2020-05-04 ENCOUNTER — Other Ambulatory Visit (HOSPITAL_COMMUNITY): Payer: Medicare Other

## 2020-10-05 ENCOUNTER — Other Ambulatory Visit: Payer: Self-pay | Admitting: Cardiology

## 2020-12-27 ENCOUNTER — Telehealth: Payer: Self-pay | Admitting: Cardiology

## 2020-12-27 MED ORDER — ROSUVASTATIN CALCIUM 20 MG PO TABS: 20.0000 mg | ORAL_TABLET | Freq: Every day | ORAL | 0 refills | Status: DC

## 2020-12-27 NOTE — Telephone Encounter (Signed)
*  STAT* If patient is at the pharmacy, call can be transferred to refill team.   1. Which medications need to be refilled? (please list name of each medication and dose if known) rosuvastatin (CRESTOR) 20 MG tablet  2. Which pharmacy/location (including street and city if local pharmacy) is medication to be sent to? needs it to go to [facility] phone #[phone]  [address], [place], [place] [address]  3. Do they need a 30 day or 90 day supply? 90 day

## 2021-02-26 ENCOUNTER — Telehealth: Payer: Self-pay | Admitting: Cardiology

## 2021-02-26 MED ORDER — ROSUVASTATIN CALCIUM 20 MG PO TABS: 20.0000 mg | ORAL_TABLET | Freq: Every day | ORAL | 3 refills | Status: DC

## 2021-02-26 NOTE — Telephone Encounter (Signed)
°*  STAT* If patient is at the pharmacy, call can be transferred to refill team.   1. Which medications need to be refilled? (please list name of each medication and dose if known)  rosuvastatin (CRESTOR) 20 MG tablet  2. Which pharmacy/location (including street and city if local pharmacy) is medication to be sent to? [facility] - [place], [place] - [address]  3. Do they need a 30 day or 90 day supply?   90 day supply    Patient only has a few tablets left.

## 2021-02-26 NOTE — Telephone Encounter (Signed)
Spoke to patient and he believes he has an appt for March but didn't have enough medication to last until March. Refill for Rosuvastatin (Crestor) 20 MG sent to [facility].

## 2021-08-17 IMAGING — MR MR KNEE*R* W/O CM
7 series · 39 of 40 positions shown · non-contrast
Comparison: X-ray 04/03/2019. Right femur MRI 04/05/2019

CLINICAL DATA: Right knee pain after fall 1 week ago

EXAM:
MRI OF THE RIGHT KNEE WITHOUT CONTRAST
TECHNIQUE: Multiplanar, multisequence MR imaging of the knee was performed. No
intravenous contrast was administered.

[Series 5: T2 fat-sat · axial · right · 4.0mm · 0.50mm/px · z∈[-36,+104]mm · 8 of 33 slices shown (1 of 3)]
[im 1/33]
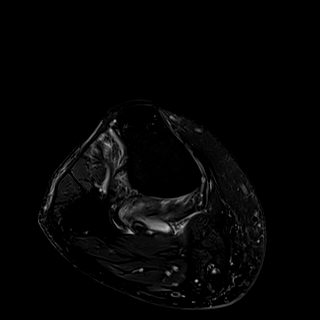
[im 5/33]
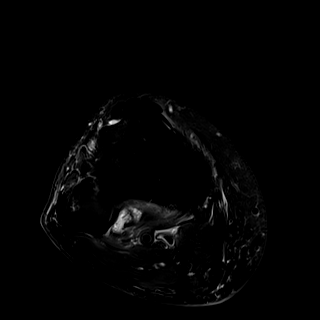
[im 10/33]
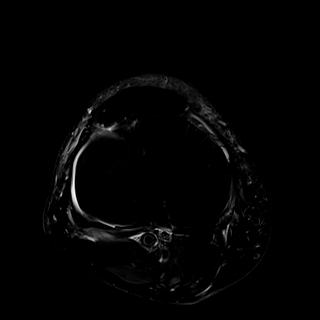
[im 14/33]
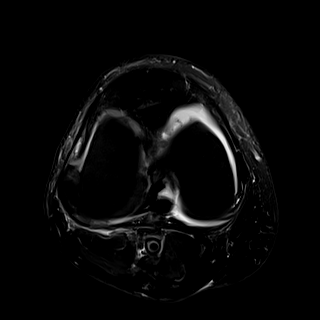
[im 19/33]
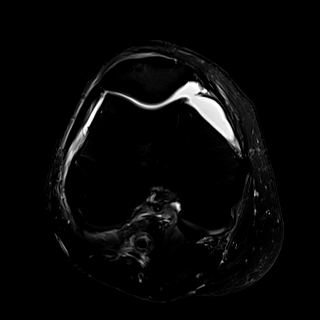
[im 23/33]
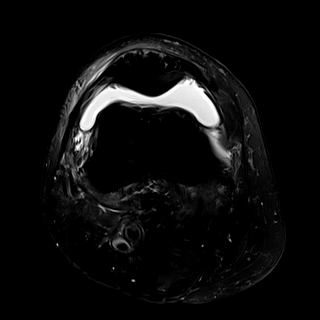
[im 28/33]
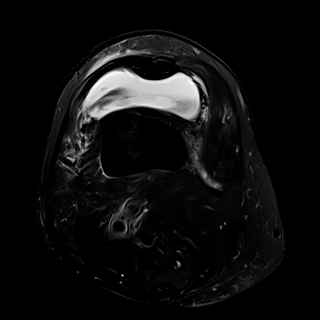
[im 33/33]
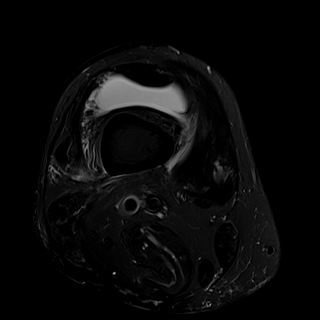

[Series 6: T1 · coronal · right · 4.0mm · 0.29mm/px · 5 of 25 slices shown]
[im 1/25]
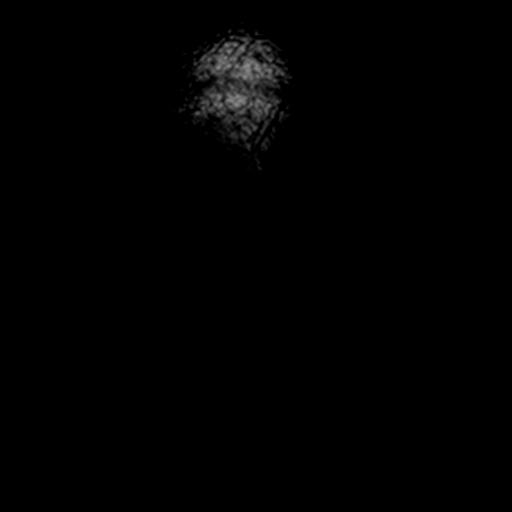
[im 5/25]
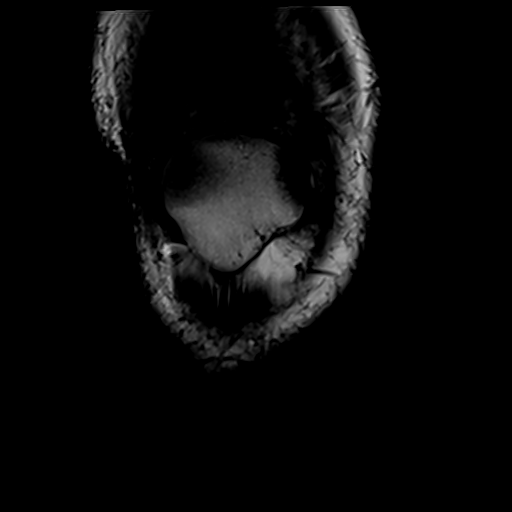
[im 10/25]
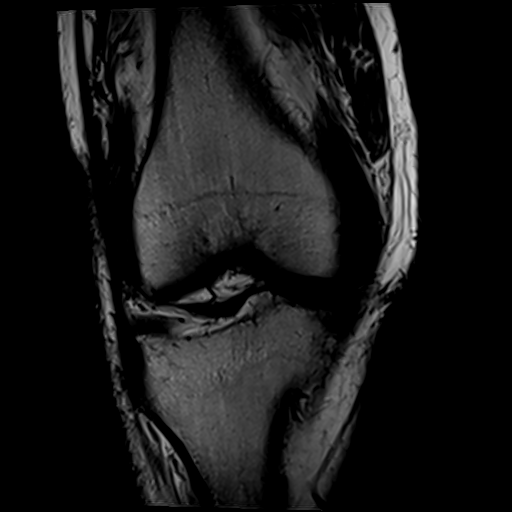
[im 15/25]
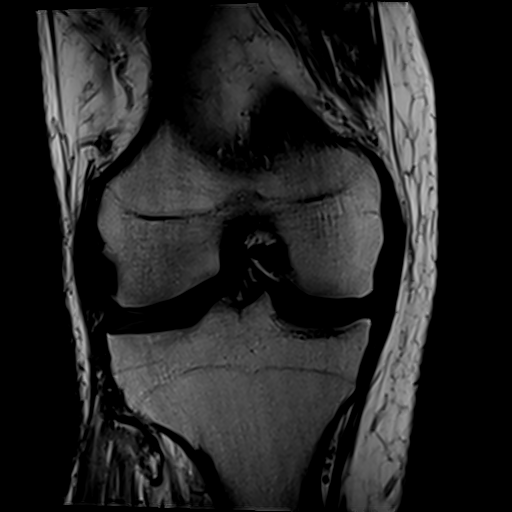
[im 20/25]
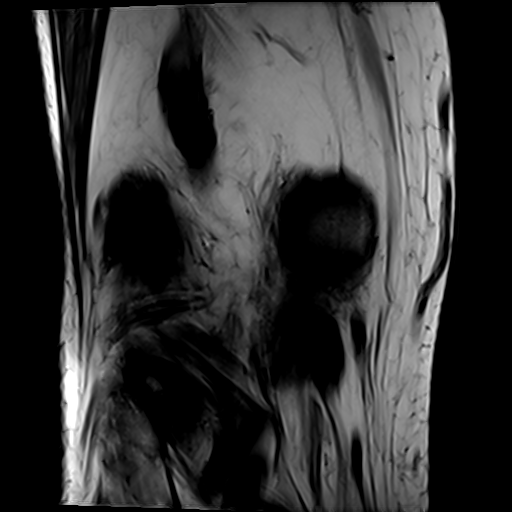

[Series 7: T2 fat-sat · coronal · right · 4.0mm · 0.59mm/px · 6 of 25 slices shown (2 of 3)]
[im 1/25]
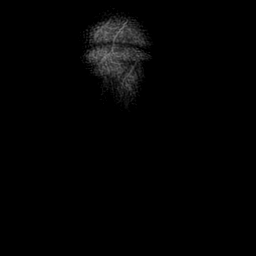
[im 5/25]
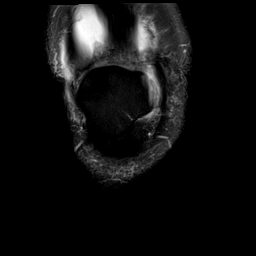
[im 10/25]
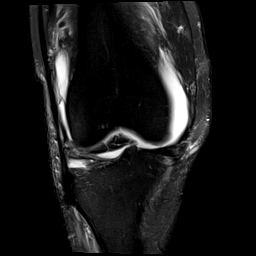
[im 15/25]
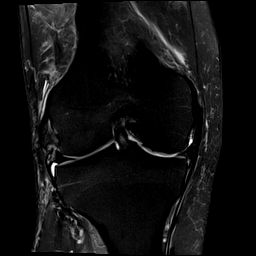
[im 20/25]
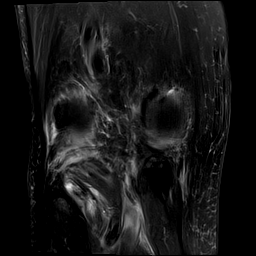
[im 25/25]
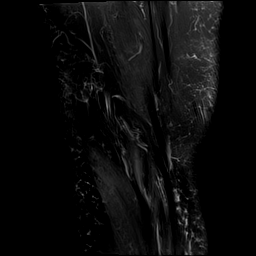

[Series 8: PD fat-sat · coronal · right · 4.0mm · 0.47mm/px · 6 of 25 slices shown (1 of 3)]
[im 1/25]
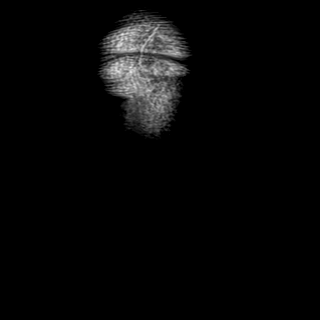
[im 5/25]
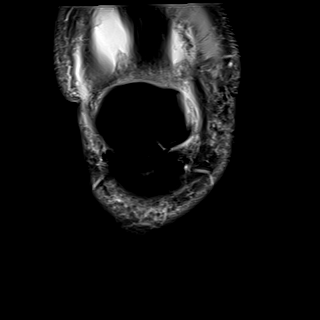
[im 10/25]
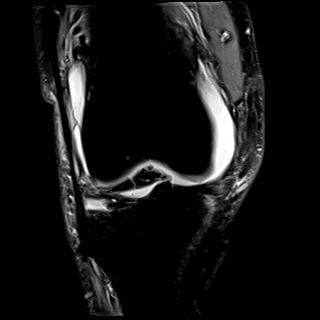
[im 15/25]
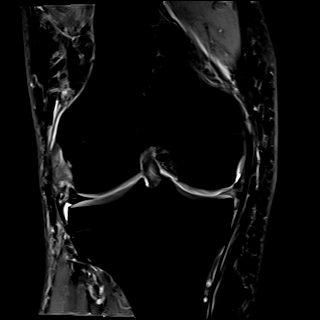
[im 20/25]
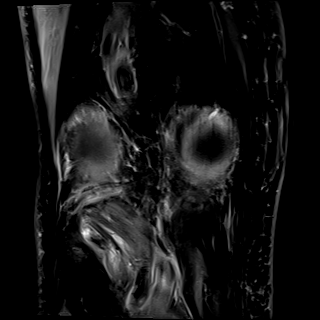
[im 25/25]
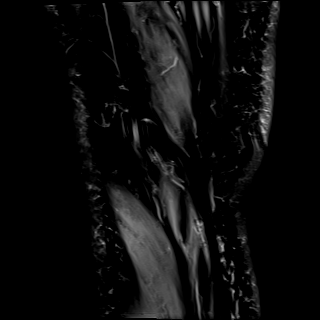

[Series 9: PD fat-sat · sagittal · right · 4.0mm · 0.47mm/px · 6 of 22 slices shown (2 of 3)]
[im 1/22]
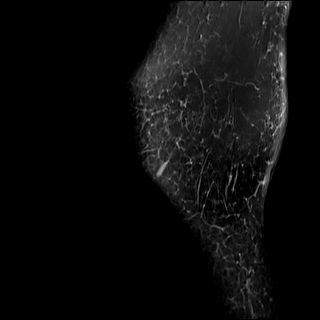
[im 5/22]
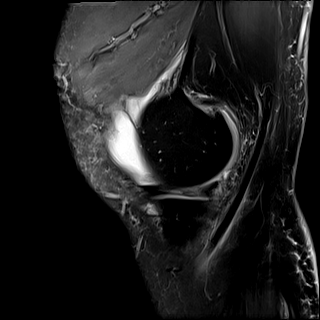
[im 9/22]
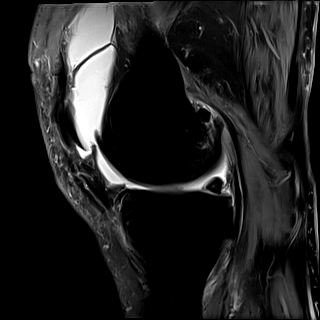
[im 13/22]
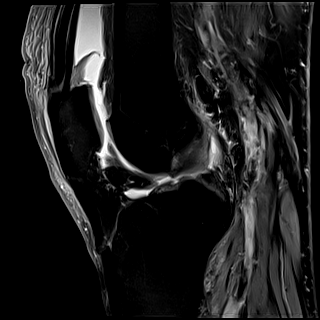
[im 17/22]
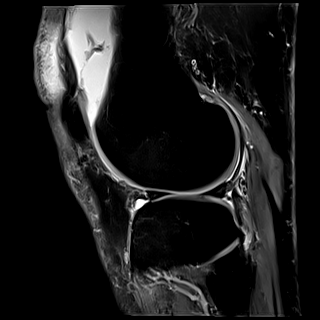
[im 22/22]
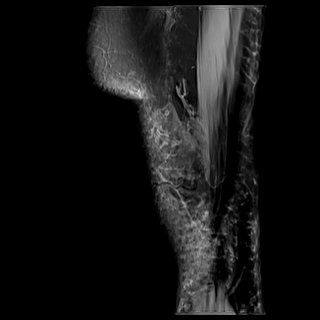

[Series 10: T2 fat-sat · sagittal · right · 4.0mm · 0.47mm/px · 6 of 22 slices shown (3 of 3)]
[im 1/22]
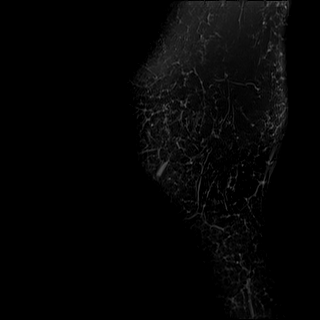
[im 5/22]
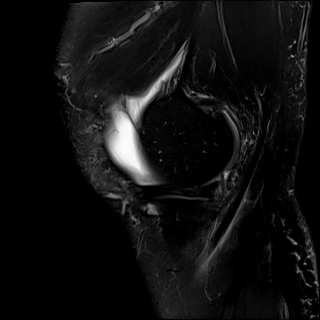
[im 9/22]
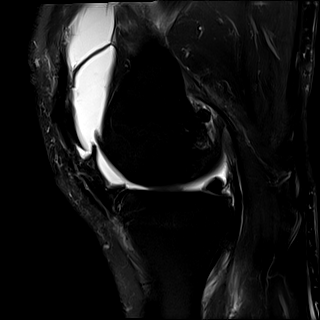
[im 13/22]
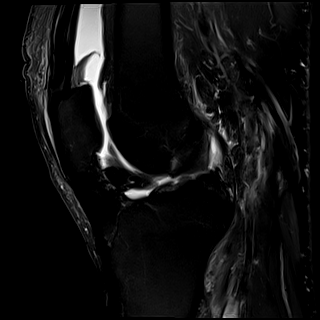
[im 17/22]
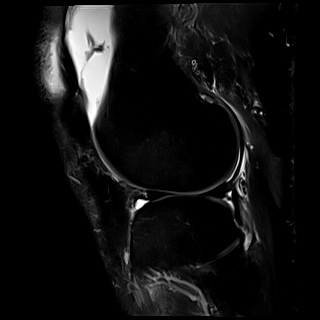
[im 22/22]
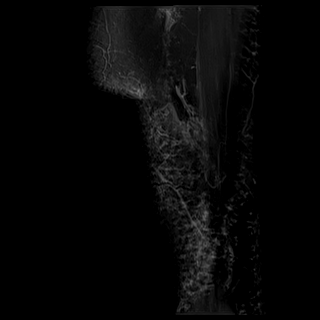

[Series 11: PD fat-sat · coronal · right · 4.0mm · 0.59mm/px · 2 of 8 slices shown (3 of 3)]
[im 1/8]
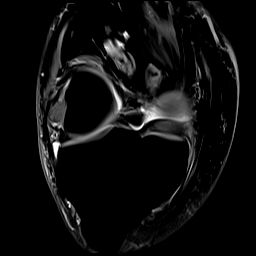
[im 8/8]
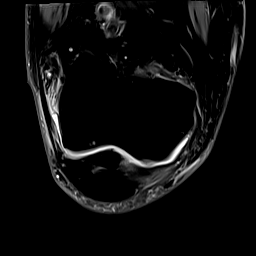

[39 of 40 positions shown; findings below may reference images not displayed]

FINDINGS: MENISCI

Medial meniscus: Focal radial tear at the posterior horn-body
junction (series 5, image 22; series 10, image 6). Possible
nondisplaced horizontal tear of the posterior horn (series 10, image
7). Mild intrasubstance degeneration of the posterior horn and body.

Lateral meniscus:  Intact.

LIGAMENTS

Cruciates:  Intact ACL and PCL.

Collaterals: Medial collateral ligament is intact. Lateral
collateral ligament complex is intact.

CARTILAGE

Patellofemoral: Trochlear chondral thinning without focal defect.
Patellar cartilage intact.

Medial: Mild partial-thickness cartilage loss of the medial
femorotibial compartment.

Lateral:  No chondral defect.

Joint:  Moderate joint effusion. Unremarkable fat pads.

Popliteal Fossa: Distal popliteus tendon is thickened and
heterogeneous (series 8, image 10) with intramuscular edema within
the popliteus muscle belly. No Baker's cyst.

Extensor Mechanism: Intact quadriceps tendon and patellar tendon.
Mild distal patellar tendinosis.

Bones: No focal marrow signal abnormality. No fracture or
dislocation.

Other: Mild prepatellar soft tissue edema. Small ganglion
posteromedial to the fibular head.
IMPRESSION: 1. Focal radial tear at the posterior horn-body junction of the
medial meniscus. Probable nondisplaced horizontal tear component
involving the posterior horn.
2. Popliteus tendon injury and muscle strain.
3. Moderate joint effusion.

## 2022-01-07 ENCOUNTER — Other Ambulatory Visit: Payer: Self-pay | Admitting: Cardiology

## 2022-05-14 ENCOUNTER — Encounter (HOSPITAL_BASED_OUTPATIENT_CLINIC_OR_DEPARTMENT_OTHER): Payer: Self-pay

## 2022-05-14 ENCOUNTER — Emergency Department (HOSPITAL_BASED_OUTPATIENT_CLINIC_OR_DEPARTMENT_OTHER): Payer: Medicare Other

## 2022-05-14 ENCOUNTER — Other Ambulatory Visit: Payer: Self-pay

## 2022-05-14 ENCOUNTER — Emergency Department (HOSPITAL_BASED_OUTPATIENT_CLINIC_OR_DEPARTMENT_OTHER)
Admission: EM | Admit: 2022-05-14 | Discharge: 2022-05-14 | Discharge status: Against Medical Advice | Payer: Medicare Other | Attending: Emergency Medicine | Admitting: Emergency Medicine

## 2022-05-14 DIAGNOSIS — R2243 Localized swelling, mass and lump, lower limb, bilateral: Secondary | ICD-10-CM | POA: Diagnosis present

## 2022-05-14 DIAGNOSIS — L0390 Cellulitis, unspecified: Secondary | ICD-10-CM

## 2022-05-14 DIAGNOSIS — Z7982 Long term (current) use of aspirin: Secondary | ICD-10-CM | POA: Insufficient documentation

## 2022-05-14 DIAGNOSIS — R600 Localized edema: Secondary | ICD-10-CM

## 2022-05-14 DIAGNOSIS — R0602 Shortness of breath: Secondary | ICD-10-CM | POA: Diagnosis not present

## 2022-05-14 LAB — LACTIC ACID, PLASMA: Lactic Acid, Venous: 1.3 mmol/L (ref 0.5–1.9)

## 2022-05-14 LAB — CBC WITH DIFFERENTIAL/PLATELET
Abs Immature Granulocytes: 0.02 10*3/uL (ref 0.00–0.07)
Basophils Absolute: 0 10*3/uL (ref 0.0–0.1)
Basophils Relative: 0 %
Eosinophils Absolute: 0.1 10*3/uL (ref 0.0–0.5)
Eosinophils Relative: 1 %
HCT: 37.5 % — ABNORMAL LOW (ref 39.0–52.0)
Hemoglobin: 12.3 g/dL — ABNORMAL LOW (ref 13.0–17.0)
Immature Granulocytes: 0 %
Lymphocytes Relative: 19 %
Lymphs Abs: 1.4 10*3/uL (ref 0.7–4.0)
MCH: 29.4 pg (ref 26.0–34.0)
MCHC: 32.8 g/dL (ref 30.0–36.0)
MCV: 89.5 fL (ref 80.0–100.0)
Monocytes Absolute: 0.7 10*3/uL (ref 0.1–1.0)
Monocytes Relative: 10 %
Neutro Abs: 5.2 10*3/uL (ref 1.7–7.7)
Neutrophils Relative %: 70 %
Platelets: 183 10*3/uL (ref 150–400)
RBC: 4.19 MIL/uL — ABNORMAL LOW (ref 4.22–5.81)
RDW: 14.9 % (ref 11.5–15.5)
WBC: 7.5 10*3/uL (ref 4.0–10.5)
nRBC: 0 % (ref 0.0–0.2)

## 2022-05-14 LAB — COMPREHENSIVE METABOLIC PANEL
ALT: 35 U/L (ref 0–44)
AST: 49 U/L — ABNORMAL HIGH (ref 15–41)
Albumin: 3.6 g/dL (ref 3.5–5.0)
Alkaline Phosphatase: 94 U/L (ref 38–126)
Anion gap: 9 (ref 5–15)
BUN: 21 mg/dL (ref 8–23)
CO2: 26 mmol/L (ref 22–32)
Calcium: 9.2 mg/dL (ref 8.9–10.3)
Chloride: 101 mmol/L (ref 98–111)
Creatinine, Ser: 0.84 mg/dL (ref 0.61–1.24)
GFR, Estimated: >60 mL/min (ref >60)
Glucose, Bld: 95 mg/dL (ref 70–99)
Potassium: 4.1 mmol/L (ref 3.5–5.1)
Sodium: 136 mmol/L (ref 135–145)
Total Bilirubin: 0.7 mg/dL (ref 0.3–1.2)
Total Protein: 6.9 g/dL (ref 6.5–8.1)

## 2022-05-14 LAB — TROPONIN I (HIGH SENSITIVITY): Troponin I (High Sensitivity): 36 ng/L — ABNORMAL HIGH (ref <18)

## 2022-05-14 LAB — BRAIN NATRIURETIC PEPTIDE: B Natriuretic Peptide: 774.7 pg/mL — ABNORMAL HIGH (ref 0.0–100.0)

## 2022-05-14 MED ORDER — DOXYCYCLINE HYCLATE 100 MG PO TABS
100.0000 mg | ORAL_TABLET | Freq: Once | ORAL | Status: AC
  Administered 2022-05-14: 100 mg via ORAL
  Filled 2022-05-14: qty 1

## 2022-05-14 MED ORDER — FUROSEMIDE 20 MG PO TABS: 20.0000 mg | ORAL_TABLET | Freq: Every day | ORAL | 0 refills | Status: AC

## 2022-05-14 MED ORDER — DOXYCYCLINE HYCLATE 100 MG PO CAPS: 100.0000 mg | ORAL_CAPSULE | Freq: Two times a day (BID) | ORAL | 0 refills | Status: AC

## 2022-05-14 MED ORDER — FUROSEMIDE 20 MG PO TABS
20.0000 mg | ORAL_TABLET | Freq: Once | ORAL | Status: DC
  Filled 2022-05-14: qty 1

## 2022-05-14 NOTE — ED Provider Notes (Signed)
[facility] Provider Note   CSN: [id number] Arrival date & time: 05/14/22  1158     History  Chief Complaint  Patient presents with   Leg Swelling    Willie Cisneros is a 89 y.o. male.  HPI   89 year old male presents emergency department with swelling and weeping of his bilateral lower extremities.  Patient states that this has been ongoing for many years, worsening in the last couple months.  He does follow-up as an outpatient but soft wraps his legs.  He states that initially the weeping was clear fluid, causing some small blisters.  This did cause open wounds and now the drainage at times seems more yellowish.  He is also developed some mild redness around the left knee.  He denies any history of DVT or CHF.  However he does admit that he sleeps sitting up in a recliner due to shortness of breath when lying flat.  Denies any chest pain, fever, chills.  States that he works out at [employer] 3 times a week and otherwise feels in his baseline health besides decreased ambulation secondary to lower leg swelling/weeping.  Home Medications Prior to Admission medications   Medication Sig Start Date End Date Taking? Authorizing Provider  aspirin EC 81 MG tablet Take 1 tablet (81 mg total) by mouth daily. Swallow whole. 10/18/19   Lewayne Bunting, MD  collagenase (SANTYL) ointment Apply 1 application topically daily. 05/20/19   Marcello Fennel, MD  latanoprost (XALATAN) 0.005 % ophthalmic solution Place 1 drop into both eyes at bedtime.     [provider]  Multiple Vitamins-Minerals (CENTRUM SILVER 50+MEN) TABS Take 1 tablet by mouth daily.    [provider]  rosuvastatin (CRESTOR) 20 MG tablet TAKE 1 TABLET BY MOUTH EVERY DAY please schedule office visit 01/09/22   Lewayne Bunting, MD      Allergies    Penicillins, Sulfa antibiotics, Clindamycin, Contrast media  [iodinated contrast media], Econazole nitrate, Erythromycin,  and Oxycodone    Review of Systems   Review of Systems  Constitutional:  Negative for fever.  Respiratory:  Negative for shortness of breath.   Cardiovascular:  Positive for leg swelling. Negative for chest pain and palpitations.       + orthopnea  Gastrointestinal:  Negative for abdominal pain, diarrhea and vomiting.  Musculoskeletal:  Negative for back pain.  Skin:  Positive for rash and wound.  Neurological:  Negative for headaches.    Physical Exam Updated Vital Signs BP 108/60 (BP Location: Right Arm)   Pulse (!) 117   Temp 98 F (36.7 C) (Oral)   Resp 18   Ht  (1.778 m)   Wt 84.6 kg   SpO2 98%   BMI 26.76 kg/m  Physical Exam Vitals and nursing note reviewed.  Constitutional:      General: He is not in acute distress.    Appearance: Normal appearance. He is not ill-appearing.  HENT:     Head: Normocephalic.     Mouth/Throat:     Mouth: Mucous membranes are moist.  Cardiovascular:     Rate and Rhythm: Normal rate.  Pulmonary:     Effort: Pulmonary effort is normal. No respiratory distress.     Comments: Decreased  breath sounds at bases Abdominal:     Palpations: Abdomen is soft.     Tenderness: There is no abdominal tenderness.  [facility] are edematous from  the knees down, currently wrapped at this time, all 10 toes appear vascularly intact, there is a small amount of erythema at the medial portion of the right knee, no active weeping noted  Skin:    General: Skin is warm.  Neurological:     Mental Status: He is alert and oriented to person, place, and time. Mental status is at baseline.  Psychiatric:        Mood and Affect: Mood normal.     ED Results / Procedures / Treatments   Labs (all labs ordered are listed, but only abnormal results are displayed) Labs Reviewed  CBC WITH DIFFERENTIAL/PLATELET  COMPREHENSIVE METABOLIC PANEL  BRAIN NATRIURETIC PEPTIDE  LACTIC ACID, PLASMA  LACTIC ACID,  PLASMA  TROPONIN I (HIGH SENSITIVITY)    EKG None  Radiology DG Chest Port 1 View  Result Date: 05/14/2022 CLINICAL DATA:  Shortness of breath EXAM: PORTABLE CHEST 1 VIEW COMPARISON:  X-ray and CT angiogram 04/03/2019 FINDINGS: Underinflation with enlarged heart. Bronchovascular crowding. No consolidation, pneumothorax or effusion. No edema. Advanced degenerative changes of the right shoulder and moderate left. Curvature and degenerative changes of the spine IMPRESSION: Underinflation.  Enlarged heart. Electronically Signed   By: Karen Kays M.D.   On: 05/14/2022 13:23    Procedures Procedures    Medications Ordered in ED Medications - No data to display  ED Course/ Medical Decision Making/ A&P                             Medical Decision Making Amount and/or Complexity of Data Reviewed Labs: ordered. Radiology: ordered.  Risk Prescription drug management.   89 year old male presents emergency department with acute on chronic lower extremity edema that is now moving towards redness and infection.  He also endorses worsening orthopnea and sleeping in a sitting up chair at night.  Follows with cardiology, last echo was 3 years ago, concern for acute CHF as well as cellulitis.  He is afebrile, tachycardic, atrial fibrillation which is baseline for the patient.  Not anticoagulated.  Blood work shows an elevated BNP and slightly elevated troponin.  Concern for CHF exacerbation.  Chest x-ray shows cardiomegaly without acute pulmonary vascular congestion.  After legs were unwrapped there is underlying erythema, multiple draining open wounds some with purulence.  Due to the extent of the swelling/wounds full DVT ultrasounds were not able to be done.  Feet at this time are warm and appear vascularly intact.  Discussed with the patient admission for IV antibiotics, diuresis and cardiology evaluation.  However the patient declines.  He states he would prefer oral medication and wants to  go home to "get things in order".  He feels like if he is admitted right now that he will die in the hospital and this is not his wish.  He has close follow-up with cardiology and will make an appointment tomorrow for ongoing evaluation of CHF.  This discussion was had in front of a family member and they were all in agreement with his decision to leave AGAINST MEDICAL ADVICE to go home.    I will treat the patient with antibiotics, Lasix and instructions for close cardiology follow-up with strict return to ED instructions.  Patient and family understand, they are polite, and they are still wishing to leave to go home.        Final Clinical Impression(s) / ED Diagnoses Final diagnoses:  None    Rx / DC Orders ED Discharge  Orders     None         Rozelle Logan, DO 05/14/22 1611

## 2022-05-14 NOTE — Discharge Instructions (Signed)
You have been seen and discharged from the emergency department.  You were found to have infection of your lower extremities and chronic peripheral edema.  I feel like this may be stemming from your heart, or heart failure.  Inpatient admission was recommended however you have declined.  You are leaving AGAINST MEDICAL ADVICE.  In this event I am sending you antibiotics for the skin infection and giving you Lasix to help with the peripheral edema.  However you need to follow-up with cardiology and have a repeat ultrasound on the heart as soon as possible.  If you have fever, worsening chest pain, difficulty breathing, further concerns for your health  or if you want to be admitted, you need to return to the emergency department immediately.  Follow-up with your primary provider for further evaluation and further care. Take home medications as prescribed.

## 2022-05-14 NOTE — ED Triage Notes (Signed)
Patient here POV from Home.  Endorses Bilateral leg Swelling that began 3 Weeks ago. More drainage recently. Seen by Dermatologist who recommended a cream but symptoms worsened.   No Fevers. No pain. Yellow Drainage. More Redness noted to Right leg.   NAD Noted during triage. A&Ox4. Gcs 15. BIB Wheelchair.

## 2022-05-14 NOTE — ED Notes (Signed)
Pt wanted to leave AMA. Signed form. MD wrote prescriptions for ABX and Lasix. Reviewed with patient and family member. Verbalized understanding and MD also spoke with patient regarding AMA status and follow-up with cardiology.  Jillyn Hidden, RN

## 2022-10-21 ENCOUNTER — Other Ambulatory Visit: Payer: Self-pay | Admitting: Cardiology

## 2022-11-22 DEATH — deceased
